# Patient Record
Sex: Female | Born: 1950 | Race: Black or African American | Hispanic: No | Marital: Married | Smoking: Never smoker
Health system: Southern US, Community
[De-identification: ages and names within clinical notes are randomized; demographics above are authoritative.]

## PROBLEM LIST (undated history)

## (undated) DIAGNOSIS — I10 Essential (primary) hypertension: Secondary | ICD-10-CM

## (undated) DIAGNOSIS — E119 Type 2 diabetes mellitus without complications: Secondary | ICD-10-CM

## (undated) DIAGNOSIS — I639 Cerebral infarction, unspecified: Secondary | ICD-10-CM

## (undated) DIAGNOSIS — C801 Malignant (primary) neoplasm, unspecified: Secondary | ICD-10-CM

## (undated) DIAGNOSIS — G629 Polyneuropathy, unspecified: Secondary | ICD-10-CM

## (undated) DIAGNOSIS — D849 Immunodeficiency, unspecified: Secondary | ICD-10-CM

## (undated) DIAGNOSIS — J45909 Unspecified asthma, uncomplicated: Secondary | ICD-10-CM

## (undated) HISTORY — PX: OTHER SURGICAL HISTORY: SHX169

## (undated) HISTORY — PX: CHOLECYSTECTOMY: SHX55

---

## 2013-04-09 DIAGNOSIS — I152 Hypertension secondary to endocrine disorders: Secondary | ICD-10-CM | POA: Diagnosis present

## 2014-10-04 DIAGNOSIS — E119 Type 2 diabetes mellitus without complications: Secondary | ICD-10-CM

## 2014-10-04 DIAGNOSIS — C9000 Multiple myeloma not having achieved remission: Secondary | ICD-10-CM | POA: Diagnosis present

## 2014-10-18 DIAGNOSIS — Z6841 Body Mass Index (BMI) 40.0 and over, adult: Secondary | ICD-10-CM

## 2021-08-18 ENCOUNTER — Encounter (HOSPITAL_COMMUNITY): Payer: Self-pay

## 2021-08-18 ENCOUNTER — Inpatient Hospital Stay (HOSPITAL_COMMUNITY)
Admission: EM | Admit: 2021-08-18 | Discharge: 2021-08-21 | DRG: 041 | Disposition: A | Discharge status: Home Health | Payer: 59 | Attending: Internal Medicine | Admitting: Internal Medicine

## 2021-08-18 ENCOUNTER — Other Ambulatory Visit: Payer: Self-pay

## 2021-08-18 DIAGNOSIS — G459 Transient cerebral ischemic attack, unspecified: Secondary | ICD-10-CM

## 2021-08-18 DIAGNOSIS — G8194 Hemiplegia, unspecified affecting left nondominant side: Secondary | ICD-10-CM | POA: Diagnosis present

## 2021-08-18 DIAGNOSIS — R471 Dysarthria and anarthria: Secondary | ICD-10-CM | POA: Diagnosis present

## 2021-08-18 DIAGNOSIS — Z7982 Long term (current) use of aspirin: Secondary | ICD-10-CM

## 2021-08-18 DIAGNOSIS — I63411 Cerebral infarction due to embolism of right middle cerebral artery: Principal | ICD-10-CM | POA: Diagnosis present

## 2021-08-18 DIAGNOSIS — Z6841 Body Mass Index (BMI) 40.0 and over, adult: Secondary | ICD-10-CM

## 2021-08-18 DIAGNOSIS — E1165 Type 2 diabetes mellitus with hyperglycemia: Secondary | ICD-10-CM | POA: Diagnosis present

## 2021-08-18 DIAGNOSIS — E1159 Type 2 diabetes mellitus with other circulatory complications: Secondary | ICD-10-CM | POA: Diagnosis present

## 2021-08-18 DIAGNOSIS — Z794 Long term (current) use of insulin: Secondary | ICD-10-CM

## 2021-08-18 DIAGNOSIS — Z20822 Contact with and (suspected) exposure to covid-19: Secondary | ICD-10-CM | POA: Diagnosis present

## 2021-08-18 DIAGNOSIS — I69354 Hemiplegia and hemiparesis following cerebral infarction affecting left non-dominant side: Secondary | ICD-10-CM

## 2021-08-18 DIAGNOSIS — G629 Polyneuropathy, unspecified: Secondary | ICD-10-CM

## 2021-08-18 DIAGNOSIS — I639 Cerebral infarction, unspecified: Secondary | ICD-10-CM

## 2021-08-18 DIAGNOSIS — I10 Essential (primary) hypertension: Secondary | ICD-10-CM | POA: Diagnosis present

## 2021-08-18 DIAGNOSIS — E1142 Type 2 diabetes mellitus with diabetic polyneuropathy: Secondary | ICD-10-CM | POA: Diagnosis present

## 2021-08-18 DIAGNOSIS — N1832 Chronic kidney disease, stage 3b: Secondary | ICD-10-CM | POA: Diagnosis present

## 2021-08-18 DIAGNOSIS — Z7984 Long term (current) use of oral hypoglycemic drugs: Secondary | ICD-10-CM

## 2021-08-18 DIAGNOSIS — Q2112 Patent foramen ovale: Secondary | ICD-10-CM

## 2021-08-18 DIAGNOSIS — E785 Hyperlipidemia, unspecified: Secondary | ICD-10-CM | POA: Diagnosis present

## 2021-08-18 DIAGNOSIS — E1122 Type 2 diabetes mellitus with diabetic chronic kidney disease: Secondary | ICD-10-CM | POA: Diagnosis present

## 2021-08-18 DIAGNOSIS — R4781 Slurred speech: Secondary | ICD-10-CM | POA: Diagnosis present

## 2021-08-18 DIAGNOSIS — E119 Type 2 diabetes mellitus without complications: Secondary | ICD-10-CM

## 2021-08-18 DIAGNOSIS — C9000 Multiple myeloma not having achieved remission: Secondary | ICD-10-CM | POA: Diagnosis present

## 2021-08-18 DIAGNOSIS — Z79899 Other long term (current) drug therapy: Secondary | ICD-10-CM

## 2021-08-18 DIAGNOSIS — R29810 Facial weakness: Secondary | ICD-10-CM | POA: Diagnosis present

## 2021-08-18 DIAGNOSIS — I152 Hypertension secondary to endocrine disorders: Secondary | ICD-10-CM | POA: Diagnosis present

## 2021-08-18 DIAGNOSIS — R29702 NIHSS score 2: Secondary | ICD-10-CM | POA: Diagnosis present

## 2021-08-18 HISTORY — DX: Essential (primary) hypertension: I10

## 2021-08-18 HISTORY — DX: Type 2 diabetes mellitus without complications: E11.9

## 2021-08-18 HISTORY — DX: Cerebral infarction, unspecified: I63.9

## 2021-08-18 HISTORY — DX: Malignant (primary) neoplasm, unspecified: C80.1

## 2021-08-18 HISTORY — DX: Immunodeficiency, unspecified: D84.9

## 2021-08-18 HISTORY — DX: Unspecified asthma, uncomplicated: J45.909

## 2021-08-18 HISTORY — DX: Polyneuropathy, unspecified: G62.9

## 2021-08-18 NOTE — ED Triage Notes (Signed)
BIB [facility] from home sudden onset of excessive aphasia lasted approximately 58min. Subsided at this time CVA and 2 TIA in 2022 with left side deficits. Port to rt chest. CBG 381

## 2021-08-19 ENCOUNTER — Emergency Department (HOSPITAL_COMMUNITY): Payer: 59

## 2021-08-19 ENCOUNTER — Observation Stay (HOSPITAL_BASED_OUTPATIENT_CLINIC_OR_DEPARTMENT_OTHER): Payer: 59

## 2021-08-19 ENCOUNTER — Observation Stay (HOSPITAL_COMMUNITY): Payer: 59

## 2021-08-19 DIAGNOSIS — R479 Unspecified speech disturbances: Secondary | ICD-10-CM | POA: Diagnosis not present

## 2021-08-19 DIAGNOSIS — I152 Hypertension secondary to endocrine disorders: Secondary | ICD-10-CM

## 2021-08-19 DIAGNOSIS — G459 Transient cerebral ischemic attack, unspecified: Secondary | ICD-10-CM | POA: Diagnosis not present

## 2021-08-19 DIAGNOSIS — I69354 Hemiplegia and hemiparesis following cerebral infarction affecting left non-dominant side: Secondary | ICD-10-CM | POA: Diagnosis not present

## 2021-08-19 DIAGNOSIS — Z794 Long term (current) use of insulin: Secondary | ICD-10-CM

## 2021-08-19 DIAGNOSIS — I639 Cerebral infarction, unspecified: Secondary | ICD-10-CM | POA: Diagnosis not present

## 2021-08-19 DIAGNOSIS — I63411 Cerebral infarction due to embolism of right middle cerebral artery: Principal | ICD-10-CM

## 2021-08-19 DIAGNOSIS — C9000 Multiple myeloma not having achieved remission: Secondary | ICD-10-CM

## 2021-08-19 DIAGNOSIS — E119 Type 2 diabetes mellitus without complications: Secondary | ICD-10-CM

## 2021-08-19 DIAGNOSIS — E1159 Type 2 diabetes mellitus with other circulatory complications: Secondary | ICD-10-CM | POA: Diagnosis not present

## 2021-08-19 DIAGNOSIS — Z6841 Body Mass Index (BMI) 40.0 and over, adult: Secondary | ICD-10-CM

## 2021-08-19 LAB — RAPID URINE DRUG SCREEN, HOSP PERFORMED
Amphetamines: NOT DETECTED
Barbiturates: NOT DETECTED
Benzodiazepines: NOT DETECTED
Cocaine: NOT DETECTED
Opiates: NOT DETECTED
Tetrahydrocannabinol: NOT DETECTED

## 2021-08-19 LAB — DIFFERENTIAL
Abs Immature Granulocytes: 0.02 10*3/uL (ref 0.00–0.07)
Basophils Absolute: 0 10*3/uL (ref 0.0–0.1)
Basophils Relative: 0 %
Eosinophils Absolute: 0.1 10*3/uL (ref 0.0–0.5)
Eosinophils Relative: 1 %
Immature Granulocytes: 0 %
Lymphocytes Relative: 11 %
Lymphs Abs: 0.8 10*3/uL (ref 0.7–4.0)
Monocytes Absolute: 0.7 10*3/uL (ref 0.1–1.0)
Monocytes Relative: 10 %
Neutro Abs: 5.3 10*3/uL (ref 1.7–7.7)
Neutrophils Relative %: 78 %

## 2021-08-19 LAB — CBC
HCT: 36.2 % (ref 36.0–46.0)
Hemoglobin: 11.7 g/dL — ABNORMAL LOW (ref 12.0–15.0)
MCH: 27.5 pg (ref 26.0–34.0)
MCHC: 32.3 g/dL (ref 30.0–36.0)
MCV: 85.2 fL (ref 80.0–100.0)
Platelets: 162 10*3/uL (ref 150–400)
RBC: 4.25 MIL/uL (ref 3.87–5.11)
RDW: 15.4 % (ref 11.5–15.5)
WBC: 6.9 10*3/uL (ref 4.0–10.5)
nRBC: 0 % (ref 0.0–0.2)

## 2021-08-19 LAB — GLUCOSE, CAPILLARY
Glucose-Capillary: 221 mg/dL — ABNORMAL HIGH (ref 70–99)
Glucose-Capillary: 307 mg/dL — ABNORMAL HIGH (ref 70–99)
Glucose-Capillary: 112 mg/dL — ABNORMAL HIGH (ref 70–99)

## 2021-08-19 LAB — URINALYSIS, ROUTINE W REFLEX MICROSCOPIC
Bilirubin Urine: NEGATIVE
Glucose, UA: >=500 mg/dL — AB
Hgb urine dipstick: NEGATIVE
Ketones, ur: 5 mg/dL — AB
Nitrite: NEGATIVE
Protein, ur: 100 mg/dL — AB
Specific Gravity, Urine: 1.021 (ref 1.005–1.030)
WBC, UA: >50 WBC/hpf — ABNORMAL HIGH (ref 0–5)
pH: 5 (ref 5.0–8.0)

## 2021-08-19 LAB — ECHOCARDIOGRAM COMPLETE
AR max vel: 1.87 cm2
AV Area VTI: 1.8 cm2
AV Area mean vel: 1.86 cm2
AV Mean grad: 3 mmHg
AV Peak grad: 5 mmHg
Ao pk vel: 1.12 m/s
Area-P 1/2: 2.59 cm2
Height: 68 in
MV VTI: 1.59 cm2
S' Lateral: 3 cm
Weight: 4160 oz

## 2021-08-19 LAB — RESP PANEL BY RT-PCR (FLU A&B, COVID) ARPGX2
Influenza A by PCR: NEGATIVE
Influenza B by PCR: NEGATIVE
SARS Coronavirus 2 by RT PCR: NEGATIVE

## 2021-08-19 LAB — COMPREHENSIVE METABOLIC PANEL
ALT: 11 U/L (ref 0–44)
AST: 23 U/L (ref 15–41)
Albumin: 3.2 g/dL — ABNORMAL LOW (ref 3.5–5.0)
Alkaline Phosphatase: 109 U/L (ref 38–126)
Anion gap: 11 (ref 5–15)
BUN: 10 mg/dL (ref 8–23)
CO2: 25 mmol/L (ref 22–32)
Calcium: 9 mg/dL (ref 8.9–10.3)
Chloride: 104 mmol/L (ref 98–111)
Creatinine, Ser: 1.27 mg/dL — ABNORMAL HIGH (ref 0.44–1.00)
GFR, Estimated: 46 mL/min — ABNORMAL LOW (ref >60)
Glucose, Bld: 328 mg/dL — ABNORMAL HIGH (ref 70–99)
Potassium: 4.1 mmol/L (ref 3.5–5.1)
Sodium: 140 mmol/L (ref 135–145)
Total Bilirubin: 0.9 mg/dL (ref 0.3–1.2)
Total Protein: 6.1 g/dL — ABNORMAL LOW (ref 6.5–8.1)

## 2021-08-19 LAB — LIPID PANEL
Cholesterol: 118 mg/dL (ref 0–200)
HDL: 47 mg/dL (ref >40)
LDL Cholesterol: 47 mg/dL (ref 0–99)
Total CHOL/HDL Ratio: 2.5 RATIO
Triglycerides: 118 mg/dL (ref <150)
VLDL: 24 mg/dL (ref 0–40)

## 2021-08-19 LAB — HIV ANTIBODY (ROUTINE TESTING W REFLEX): HIV Screen 4th Generation wRfx: NONREACTIVE

## 2021-08-19 LAB — ETHANOL: Alcohol, Ethyl (B): <10 mg/dL (ref <10)

## 2021-08-19 LAB — APTT: aPTT: 27 seconds (ref 24–36)

## 2021-08-19 LAB — PROTIME-INR
INR: 1 (ref 0.8–1.2)
Prothrombin Time: 13.4 seconds (ref 11.4–15.2)

## 2021-08-19 LAB — CBG MONITORING, ED: Glucose-Capillary: 209 mg/dL — ABNORMAL HIGH (ref 70–99)

## 2021-08-19 IMAGING — CT CT HEAD W/O CM
3 series · 16 of 47 positions shown, 19 images · non-contrast
Comparison: None.

CLINICAL DATA: Sudden onset of expressive aphasia.



[Series 2: head 5.0 h30s · axial · 0.43mm/px · z∈[-125,+25]mm · 10 of 36 slices shown, 13 images]
[im 3/36  brain]
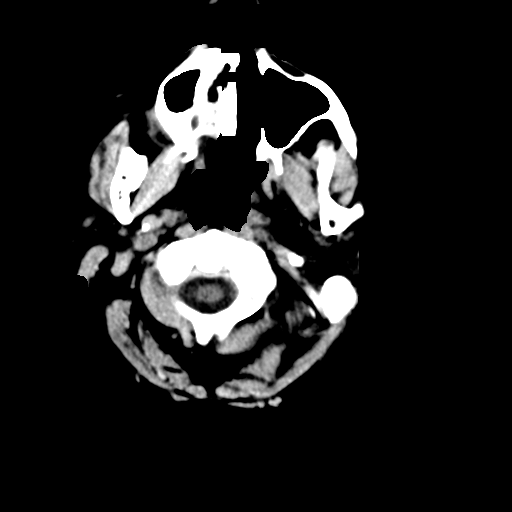
[im 3/36  bone]
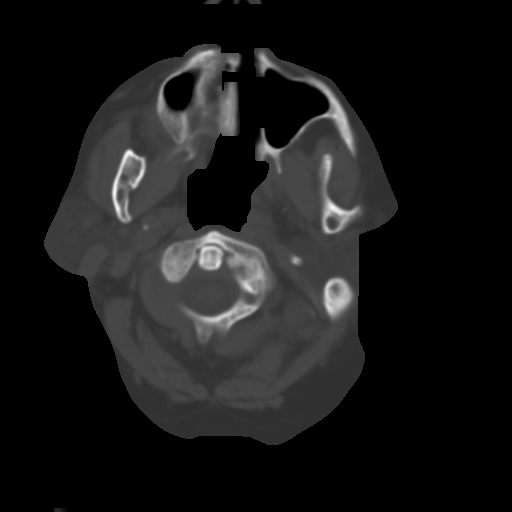
[im 7/36  brain]
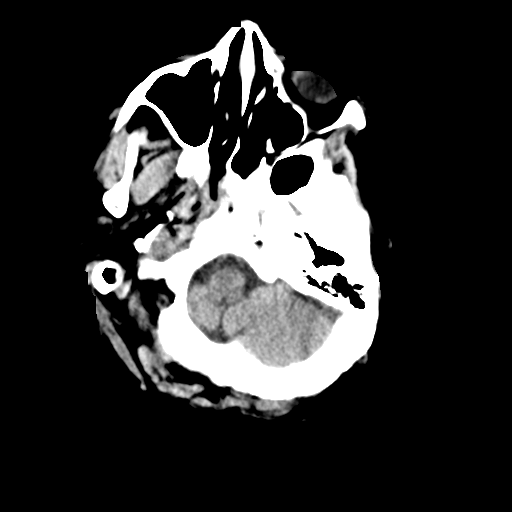
[im 10/36  brain]
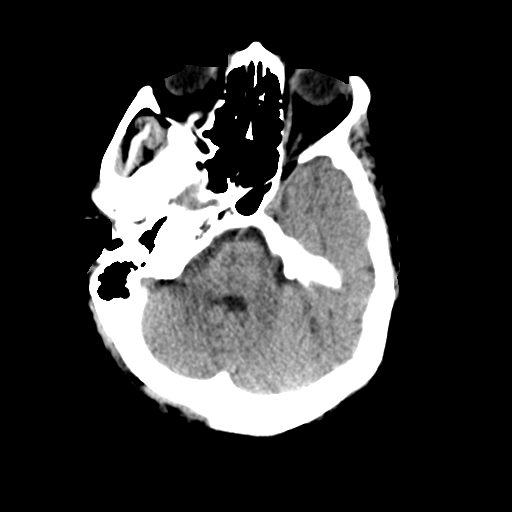
[im 13/36  brain]
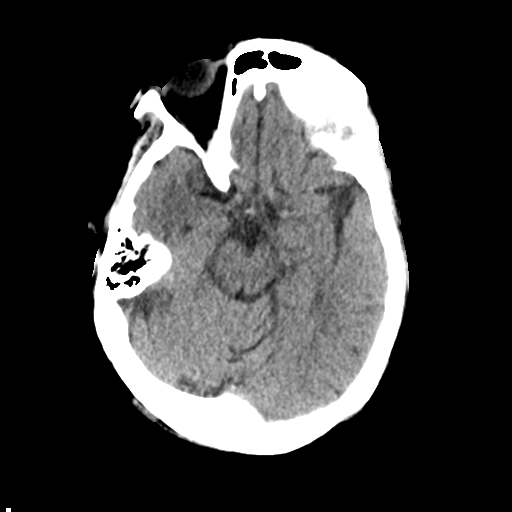
[im 16/36  brain]
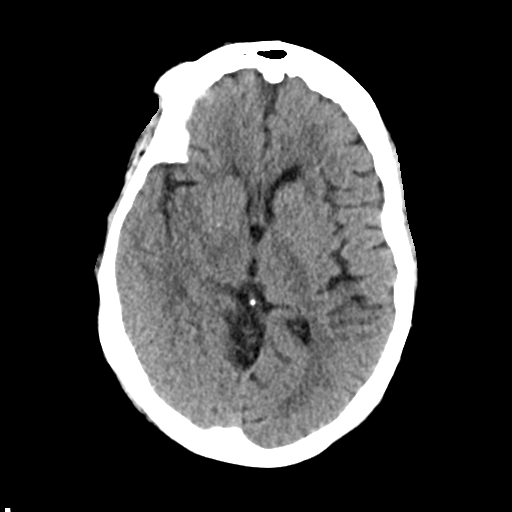
[im 16/36  bone]
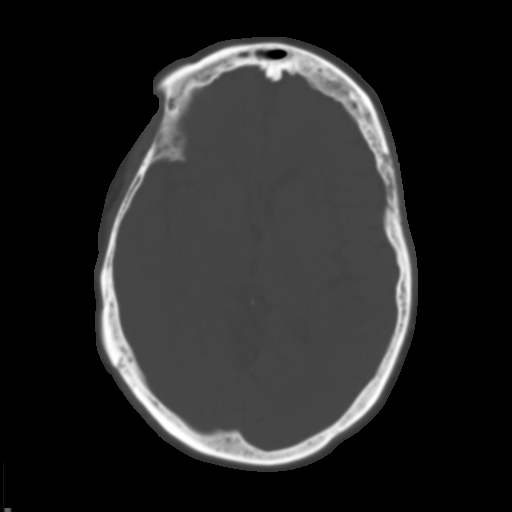
[im 20/36  brain]
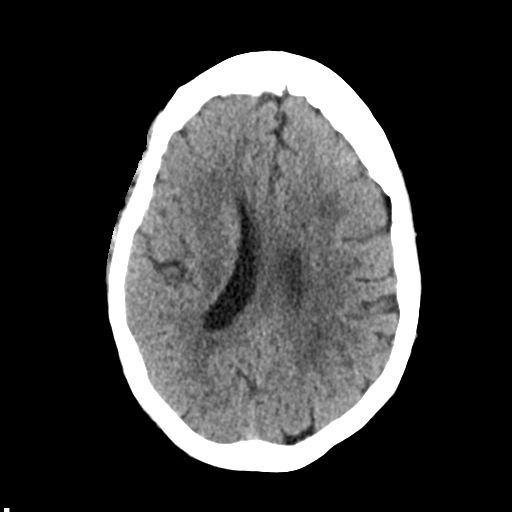
[im 23/36  brain]
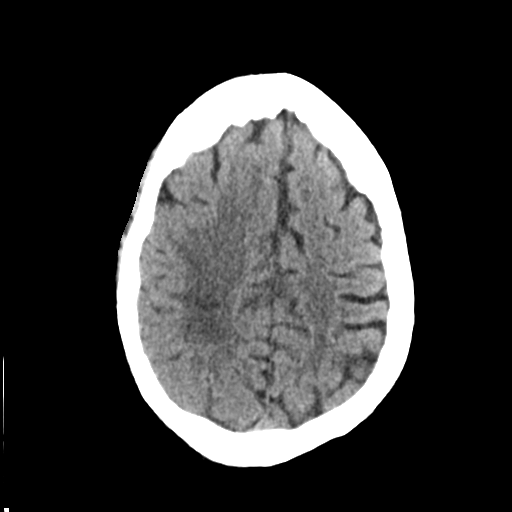
[im 27/36  brain]
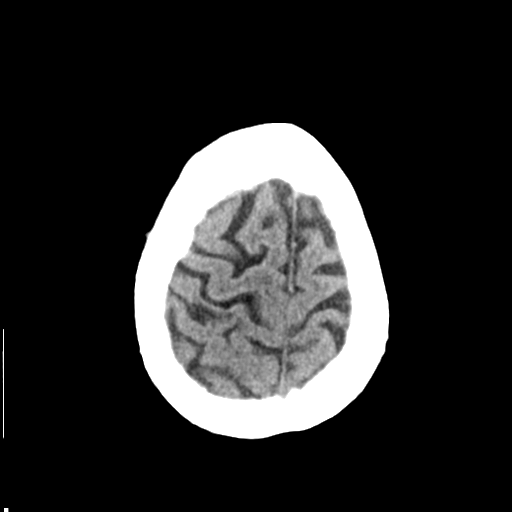
[im 29/36  brain]
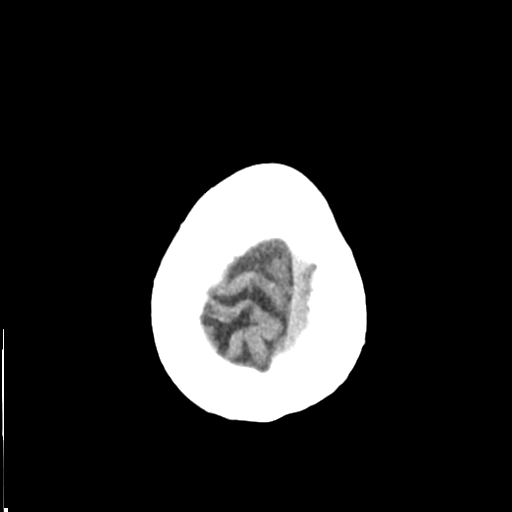
[im 29/36  bone]
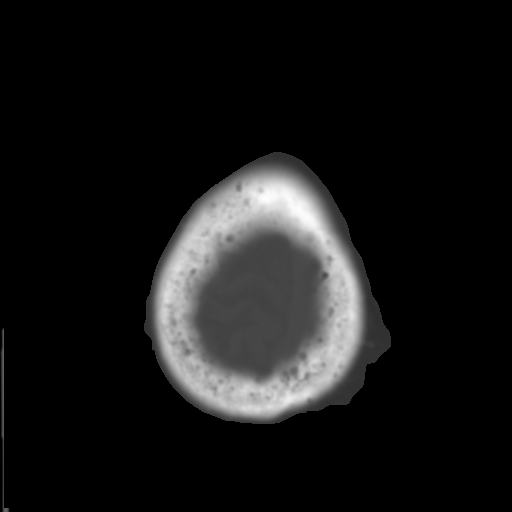
[im 33/36  brain]
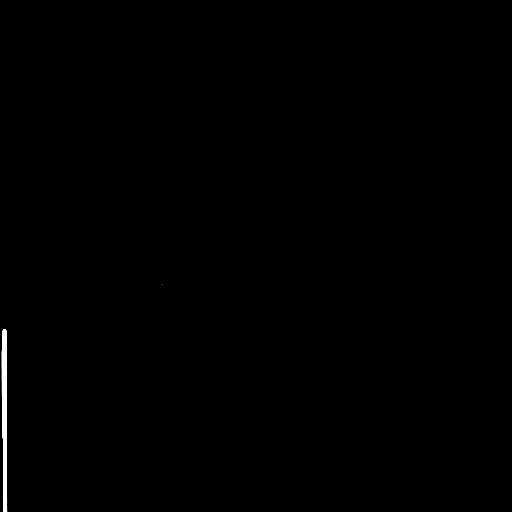

[Series 4: head 3.0 mpr cor · coronal · 0.33mm/px · 3 of 68 slices shown]
[im 23/68  brain]
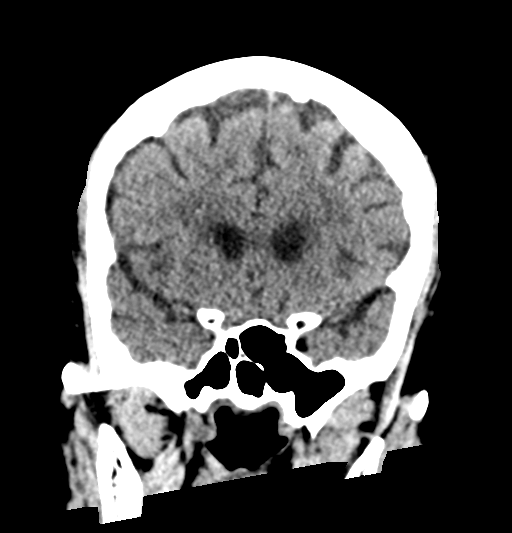
[im 30/68  brain]
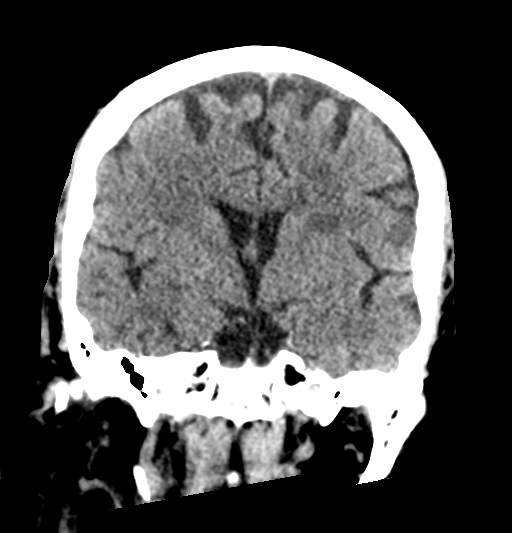
[im 38/68  brain]
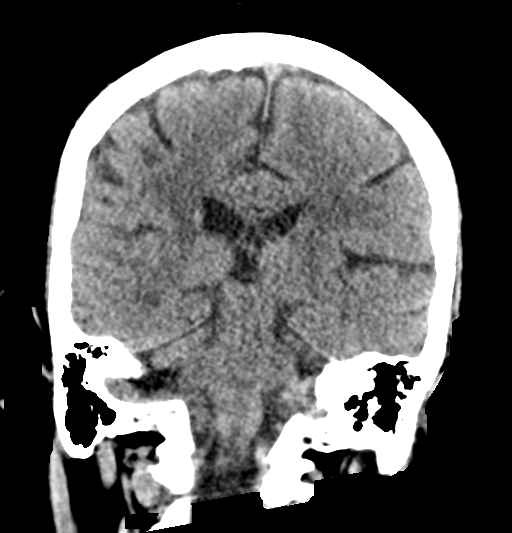

[Series 5: head 3.0 mpr sag · sagittal · 0.36mm/px · 3 of 57 slices shown]
[im 24/57  brain]
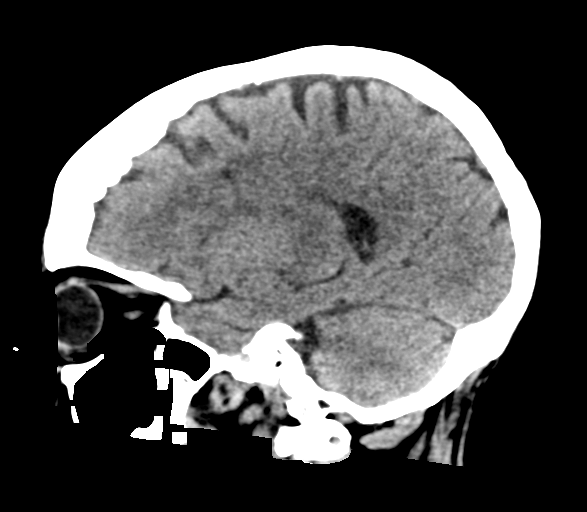
[im 29/57  brain]
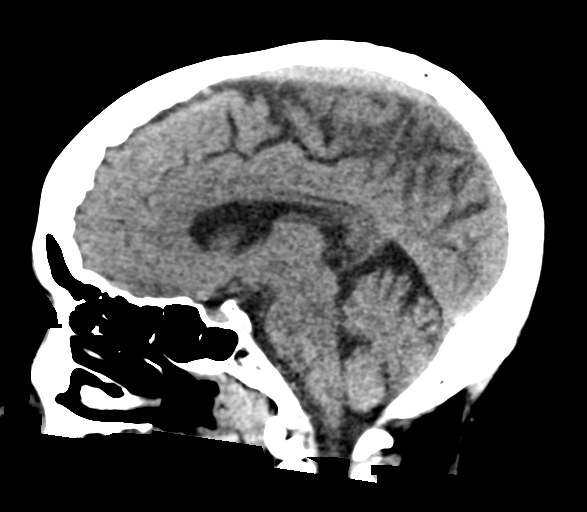
[im 33/57  brain]
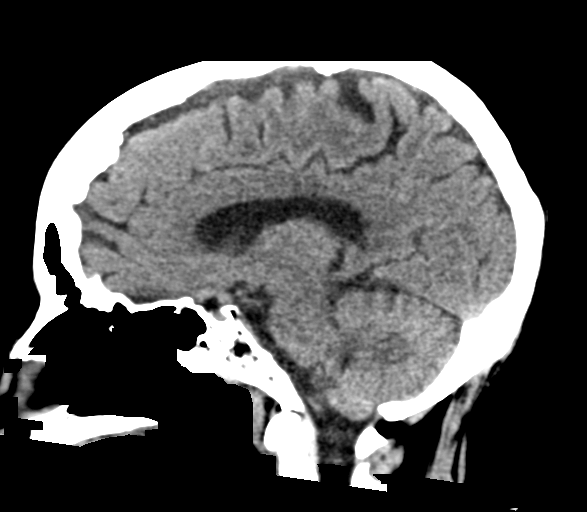

[16 of 47 positions shown; findings below may reference images not displayed]

FINDINGS: Brain: There is mild cerebral atrophy with widening of the
extra-axial spaces and ventricular dilatation.
There are areas of decreased attenuation within the white matter
tracts of the supratentorial brain, consistent with microvascular
disease changes.

Small chronic right anterior parietal and right occipital lobe
infarcts are seen.

Vascular: No hyperdense vessel or unexpected calcification.

Skull: Negative for acute fracture. Innumerable benign appearing
subcentimeter round lytic areas are seen throughout the skull.

Sinuses/Orbits: No acute finding.

Other: None.
IMPRESSION: 1. No acute intracranial abnormality.
2. Small chronic right anterior parietal and right occipital lobe
infarcts.
3. Generalized cerebral atrophy

## 2021-08-19 IMAGING — MR MR MRA HEAD W/O CM
2 series · 19 of 48 positions shown · non-contrast
Comparison: Noncontrast head CT [MY] hours today.

CLINICAL DATA: 70-year-old female TIA.

EXAM:
MRA HEAD WITHOUT CONTRAST
TECHNIQUE: Angiographic images of the Circle of Willis were acquired using MRA
technique without intravenous contrast.

[Series 2: ax (id) · axial · 1.0mm · 0.43mm/px · z∈[-49,+38]mm · 18 of 183 slices shown]
[im 1/183]
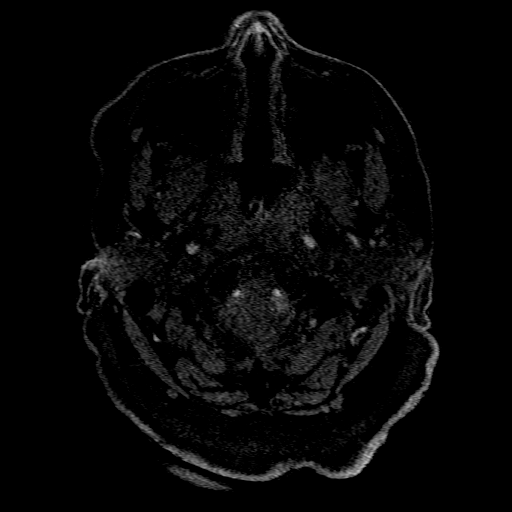
[im 4/183]
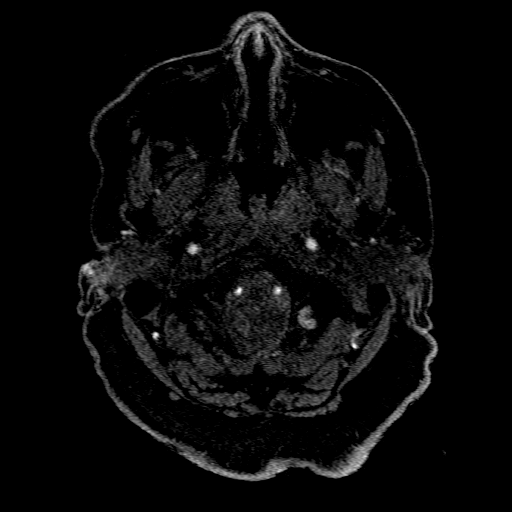
[im 8/183]
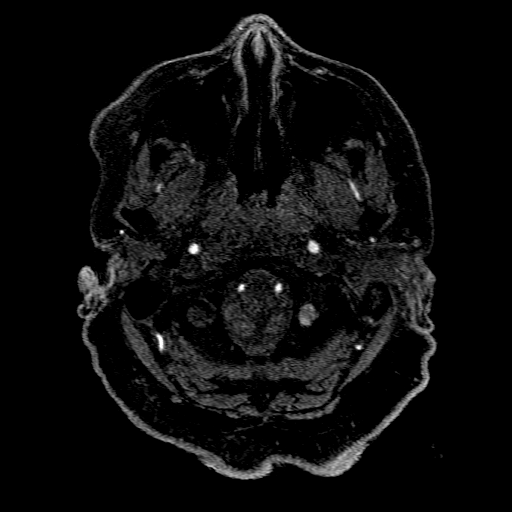
[im 12/183]
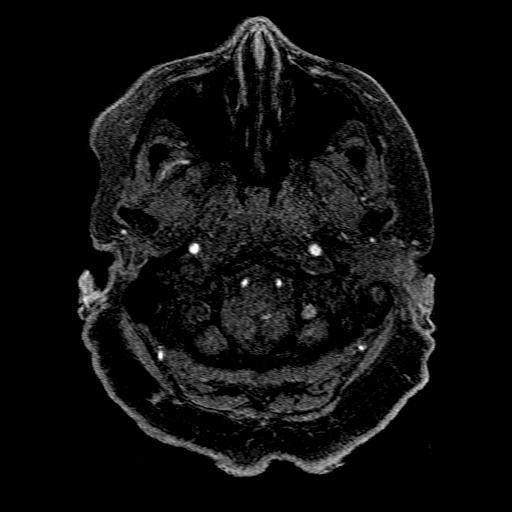
[im 16/183]
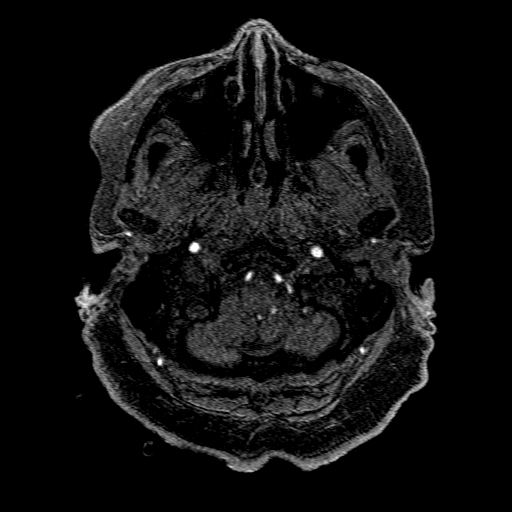
[im 20/183]
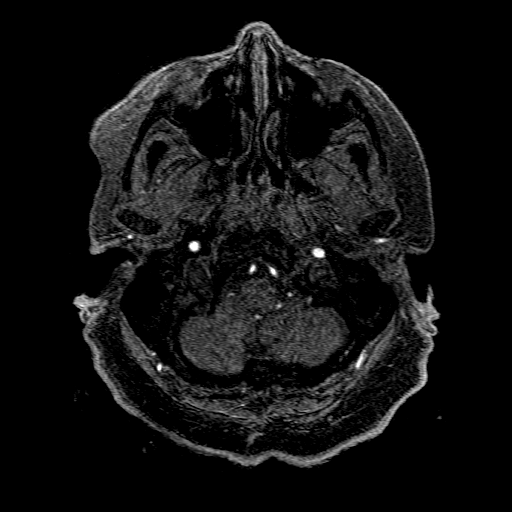
[im 24/183]
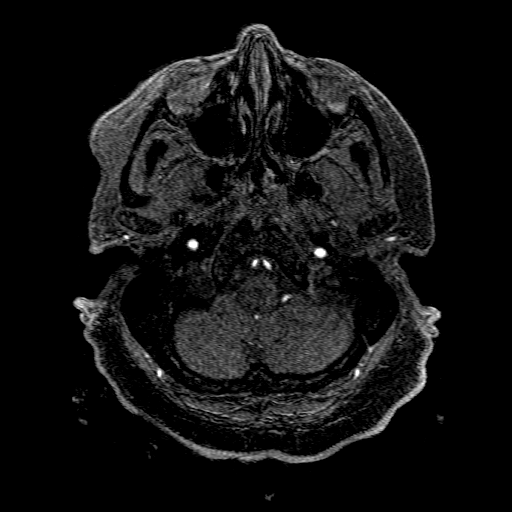
[im 28/183]
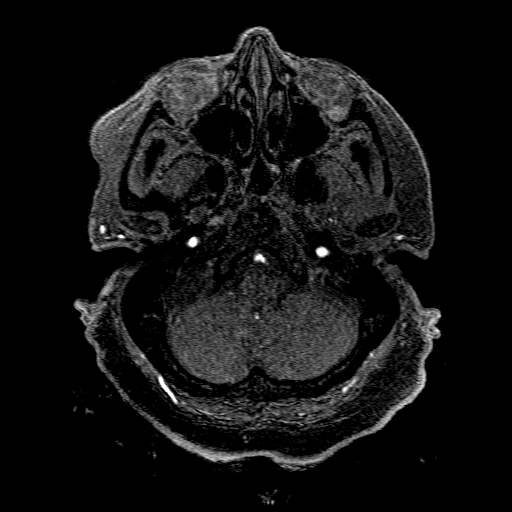
[im 32/183]
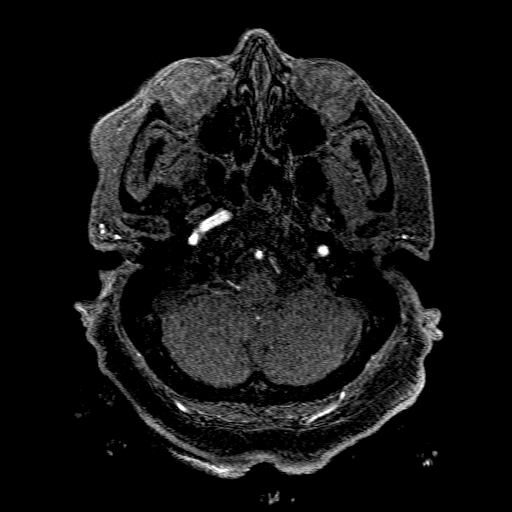
[im 36/183]
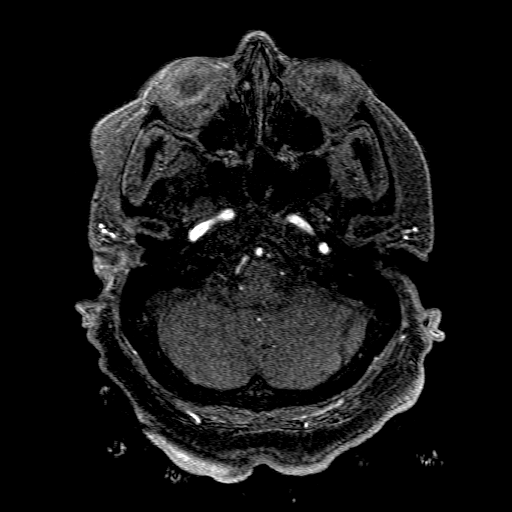
[im 56/183]
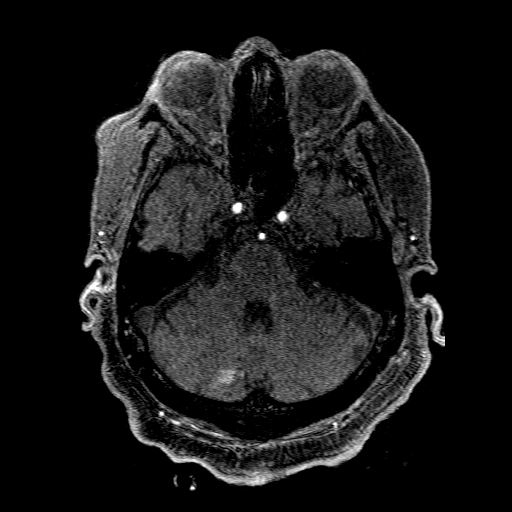
[im 80/183]
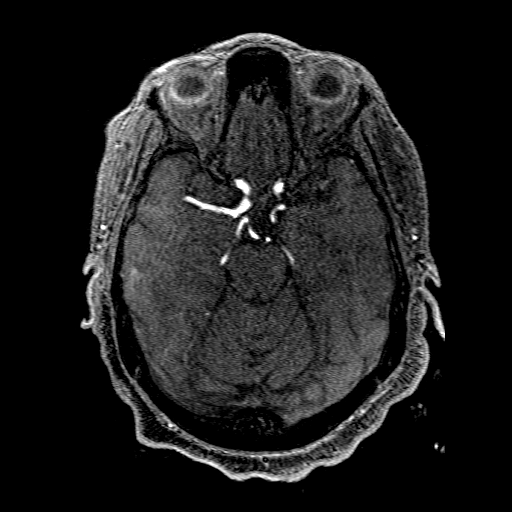
[im 92/183]
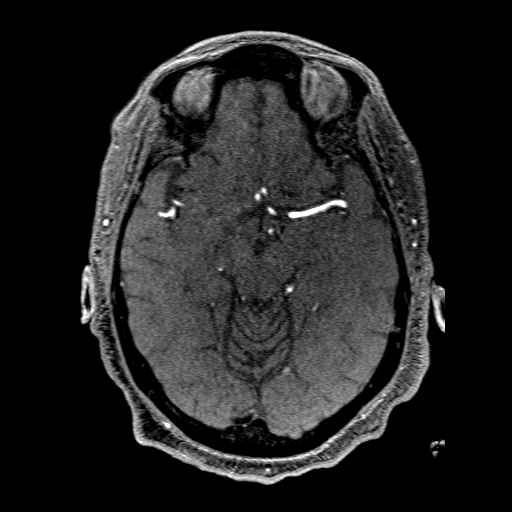
[im 103/183]
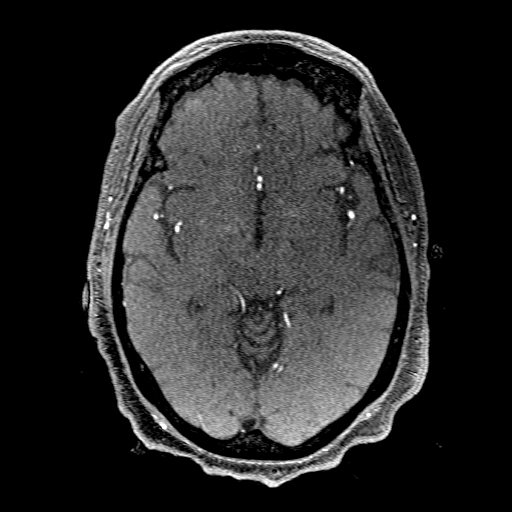
[im 127/183]
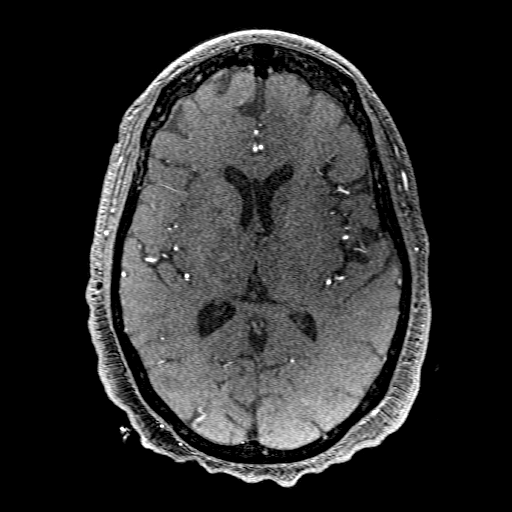
[im 151/183]
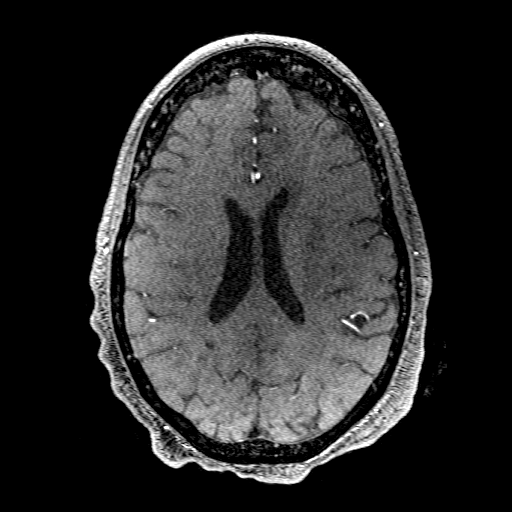
[im 155/183]
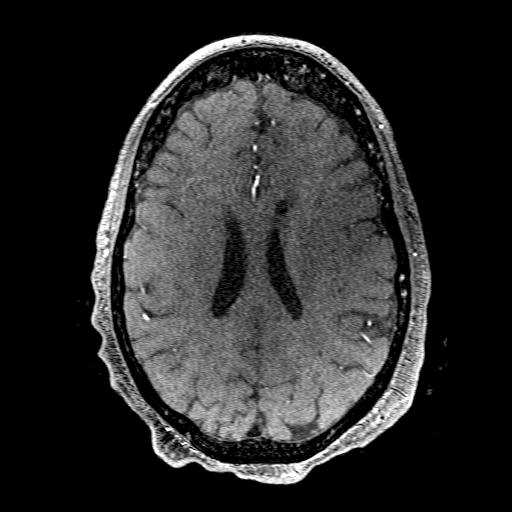
[im 175/183]
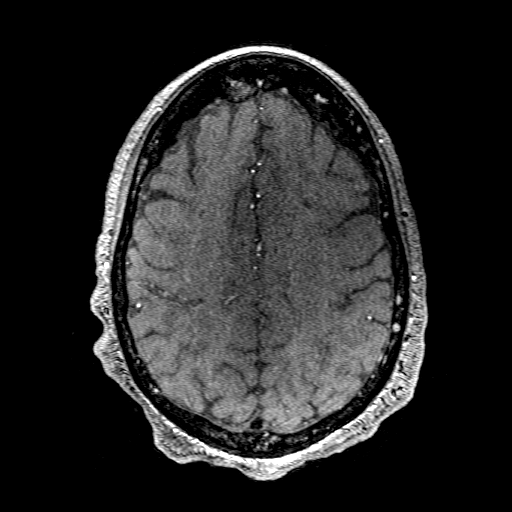

[Series 201: pjn:ax (id) · sagittal · 1.0mm · 0.43mm/px · 1 of 5 slices shown]
[im 1/5]
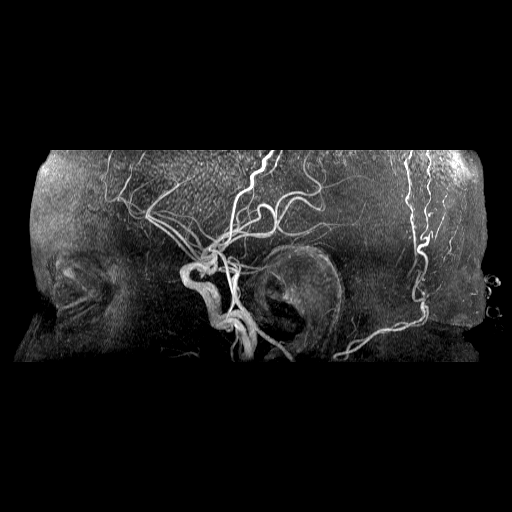

[19 of 48 positions shown; findings below may reference images not displayed]

FINDINGS: Anterior circulation: Antegrade flow in both ICA siphons. Mild
siphon irregularity. No siphon stenosis. Normal bilateral posterior
communicating artery origins. Patent carotid termini, MCA and ACA
origins. Diminutive or absent anterior communicating artery. Visible
bilateral ACA branches are within normal limits. Bilateral MCA M1
segments and MCA bifurcations are patent. Visible bilateral MCA
branches are within normal limits.

Posterior circulation: Antegrade flow in the posterior circulation
with codominant distal vertebral arteries. Patent left PICA and
dominant appearing right AICA origins. No distal vertebral or
vertebrobasilar junction stenosis. Patent basilar artery without
stenosis. Patent SCA and PCA origins. Tortuous right PCA P1 segment.
Fetal type left PCA origin. Right posterior communicating artery
present but smaller. Bilateral PCA branches are within normal
limits.

Anatomic variants: Fetal type left PCA origin.

Other: Abnormal susceptibility in the posterior right greater than
left cerebellum, follow-up MRI brain pending at this time. No
intracranial mass effect or ventriculomegaly.
IMPRESSION: 1. Negative intracranial MRA.
2. Brain MRI pending, reported separately.

## 2021-08-19 IMAGING — MR MR MRA NECK W/O CM
1 of 3 series · 19 of 48 positions shown · non-contrast
Comparison: Brain MRI and intracranial MRA today reported
separately.

CLINICAL DATA: 70-year-old female with TIA. Patchy acute right MCA
and acute to subacute right PCA territory infarcts on brain MRI
today.

EXAM:
MRA NECK WITHOUT CONTRAST
TECHNIQUE: Angiographic images of the neck were acquired using MRA technique
without intravenous contrast. Carotid stenosis measurements (when
applicable) are obtained utilizing NASCET criteria, using the distal
internal carotid diameter as the denominator.

[Series 4: sag inhance (id) · sagittal · 1.2mm · 0.47mm/px · 19 of 357 slices shown]
[im 1/357]
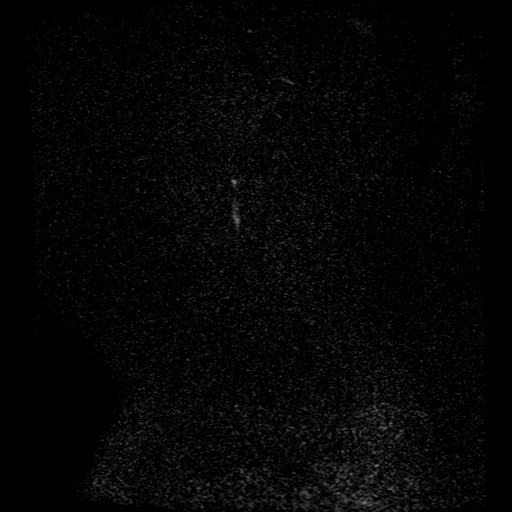
[im 13/357]
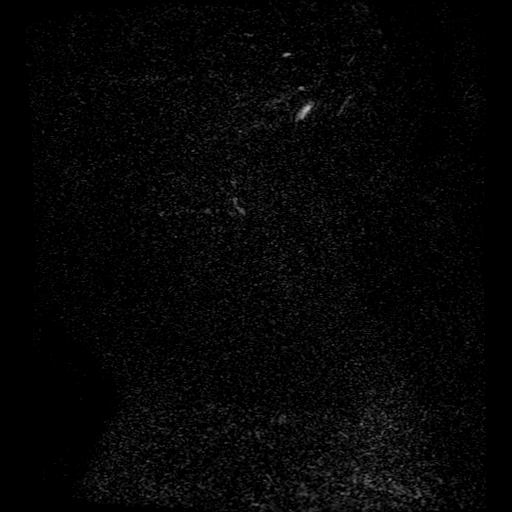
[im 26/357]
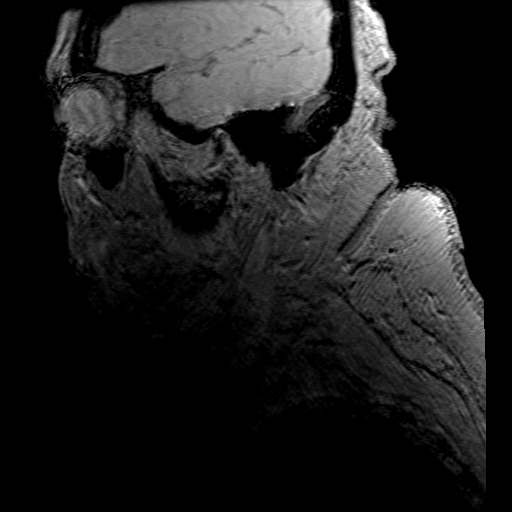
[im 39/357]
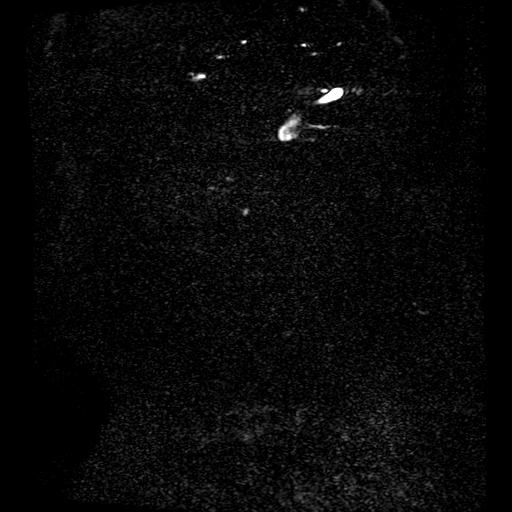
[im 51/357]
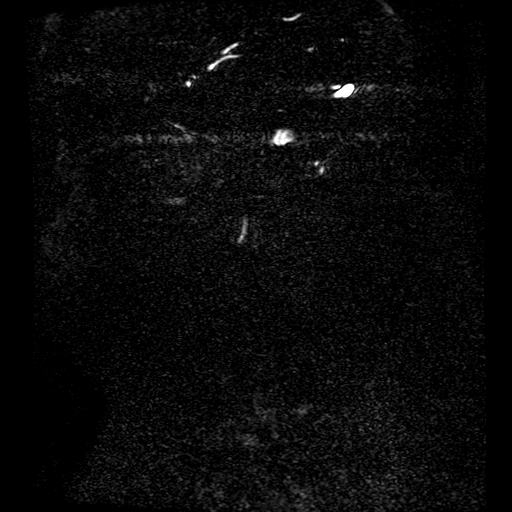
[im 64/357]
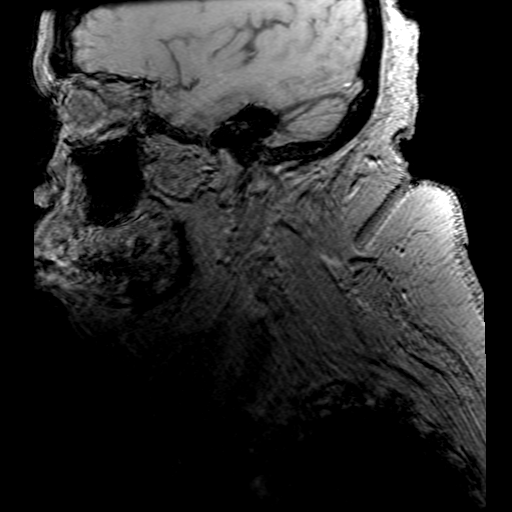
[im 77/357]
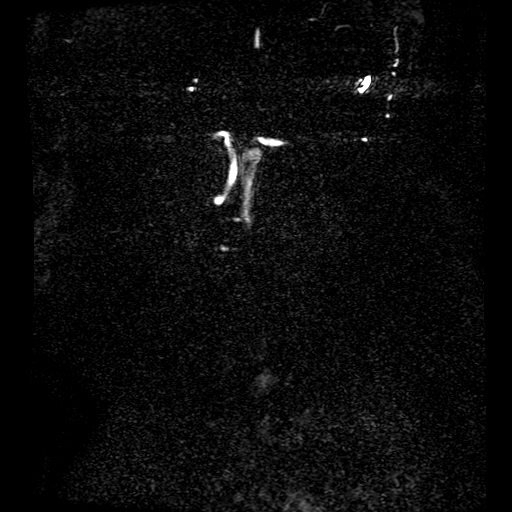
[im 90/357]
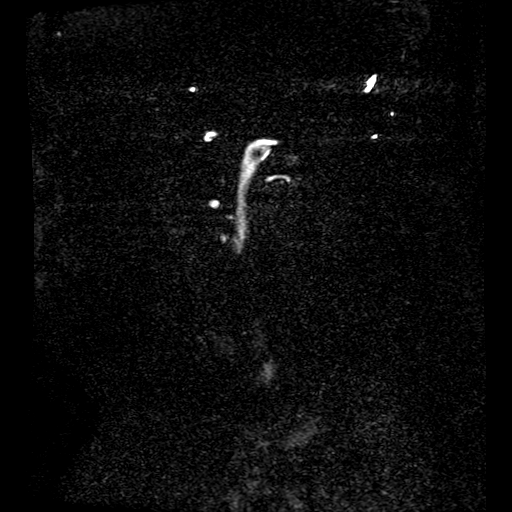
[im 102/357]
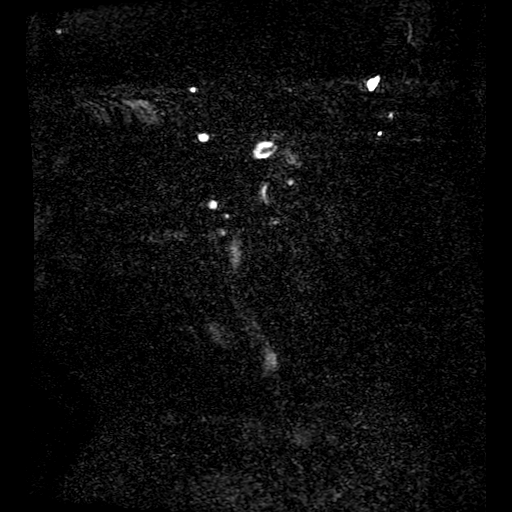
[im 115/357]
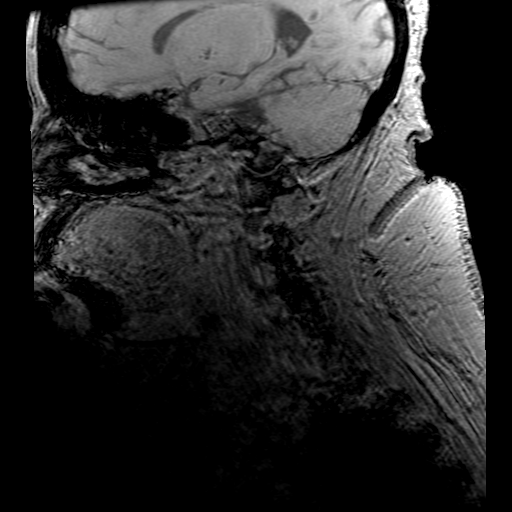
[im 128/357]
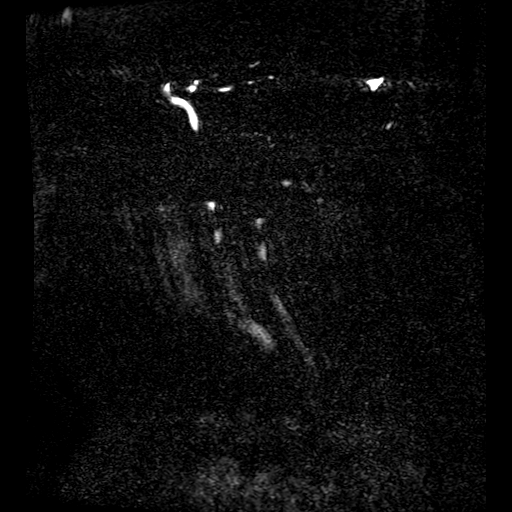
[im 140/357]
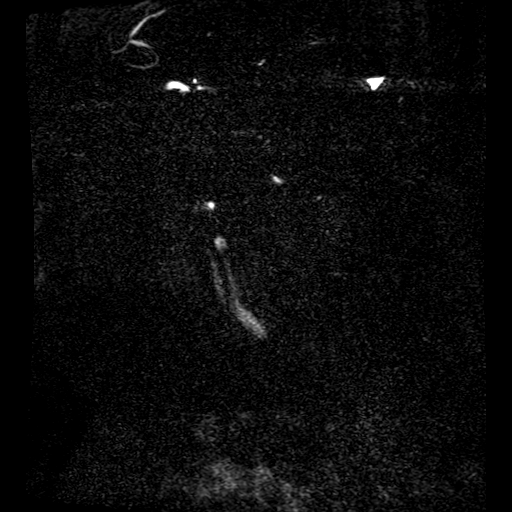
[im 153/357]
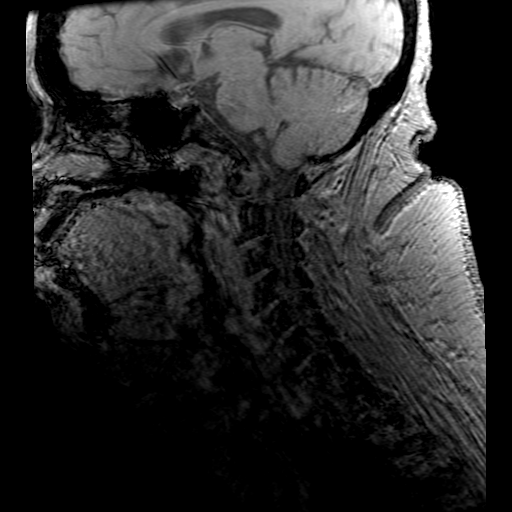
[im 179/357]
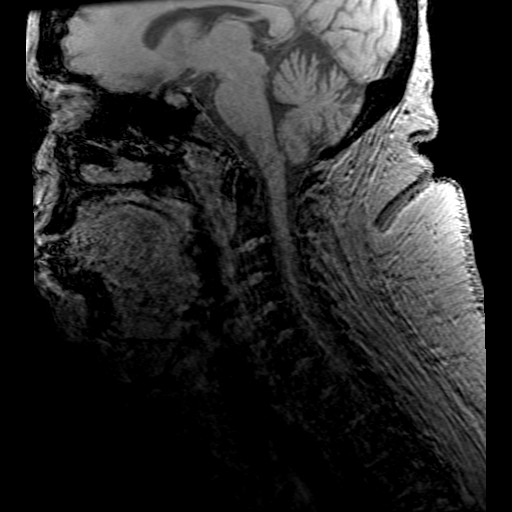
[im 204/357]
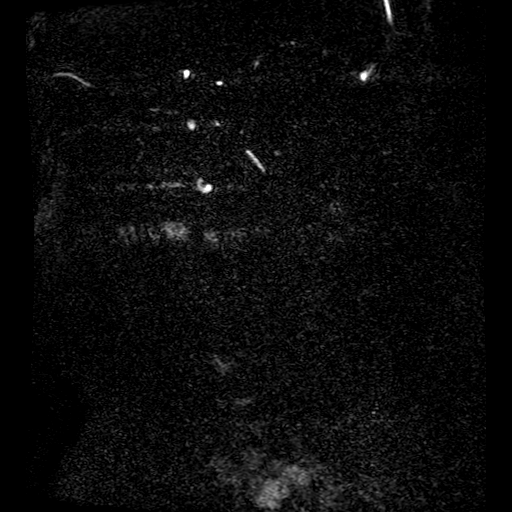
[im 242/357]
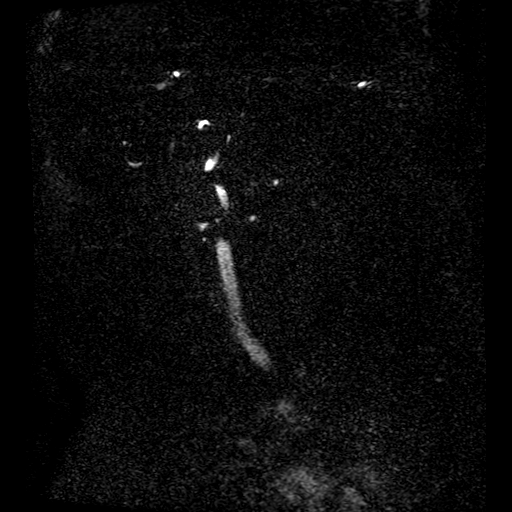
[im 293/357]
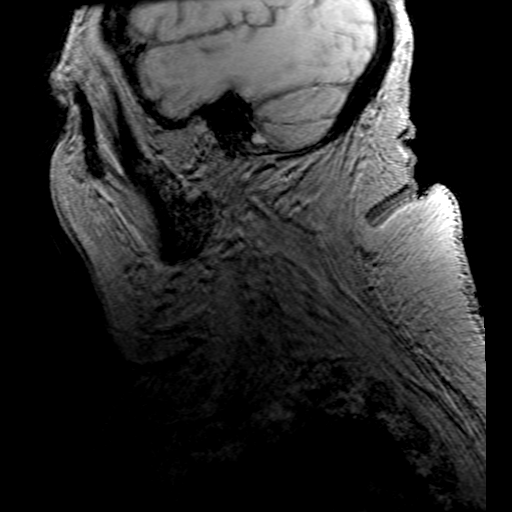
[im 306/357]
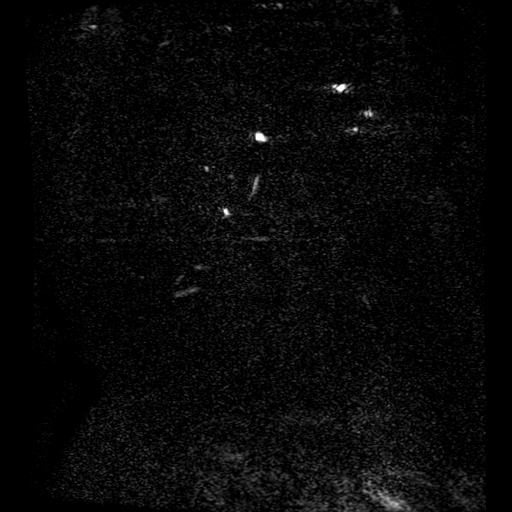
[im 344/357]
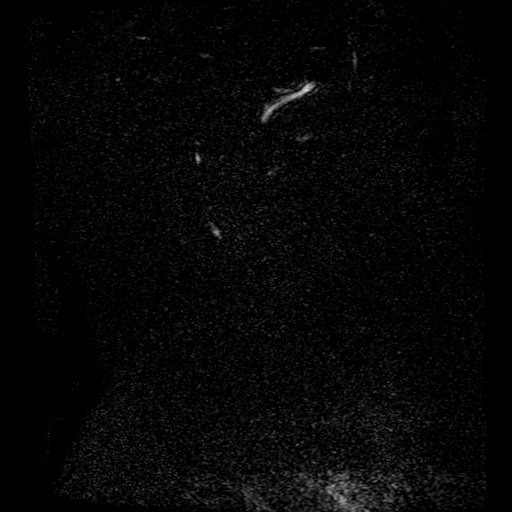

[19 of 48 positions shown; findings below may reference images not displayed]

FINDINGS: 3D time-of-flight imaging. Mild motion degradation.

Limited detail of the aortic arch and proximal great vessels.

Partially retropharyngeal course of the right carotid in the neck
with preserved antegrade right carotid flow to the skull base. No
evidence of hemodynamically significant stenosis.

Antegrade left carotid flow in the neck to the skull base. Tortuous
left ICA distal to the bulb, but no evidence of hemodynamically
significant stenosis.

Codominant appearing cervical vertebral arteries with antegrade flow
signal to the vertebrobasilar junction. Proximal vertebral artery
detail limited by motion, but no evidence of hemodynamically
significant stenosis.

Visible intracranial arteries appear stable from the earlier
circle-of-Willis MRA.
IMPRESSION: 1. No evidence of hemodynamically significant cervical carotid or
vertebral artery stenosis.
2. Tortuous carotid arteries, retropharyngeal on the right.

## 2021-08-19 IMAGING — DX DG CHEST 1V PORT
1 series · 1 of 1 positions shown · non-contrast
Comparison: None.

CLINICAL DATA: Port placement.

EXAM:
PORTABLE CHEST 1 VIEW

[chest]
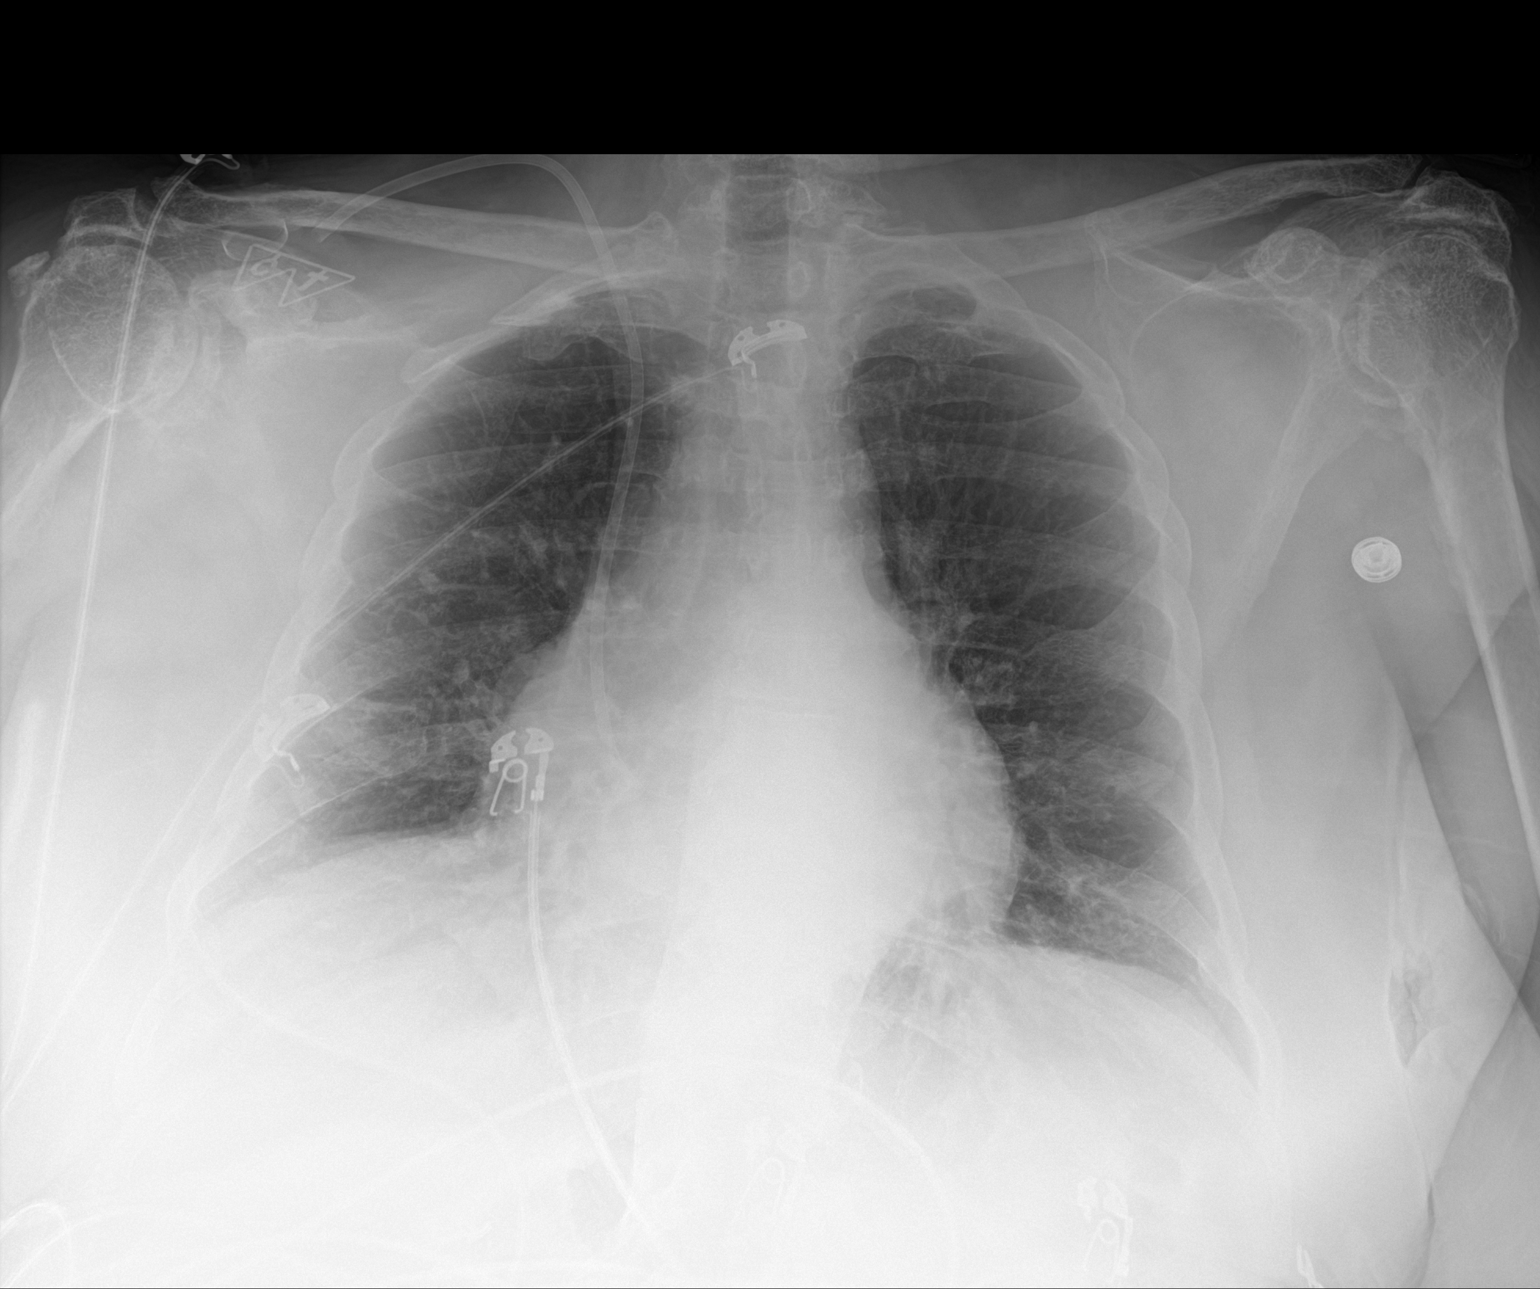

[1 of 1 positions shown; findings below may reference images not displayed]

FINDINGS: The heart size and mediastinal contours are within normal limits.
The lungs are clear without effusions or infiltrates. No
pneumothorax is seen. No acute osseous abnormality. A right internal
jugular chest port terminates over the right atrium.
IMPRESSION: Right chest port terminates over the right atrium.

## 2021-08-19 IMAGING — MR MR HEAD W/O CM
6 of 10 series · 27 of 48 positions shown · non-contrast
Comparison: Head CT [K8] hours today. Intracranial MRA today.

CLINICAL DATA: 70-year-old female TIA.

EXAM:
MRI HEAD WITHOUT CONTRAST
TECHNIQUE: Multiplanar, multiecho pulse sequences of the brain and surrounding
structures were obtained without intravenous contrast.

[Series 2: DWI · axial · 3.0mm · 0.94mm/px · z∈[-67,+80]mm · 8 of 98 slices shown (1 of 2)]
[im 1/98]
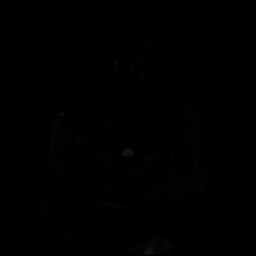
[im 11/98]
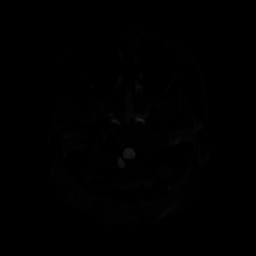
[im 33/98]
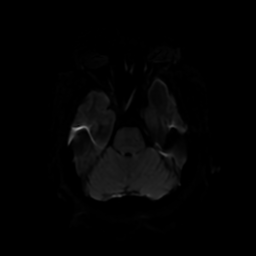
[im 44/98]
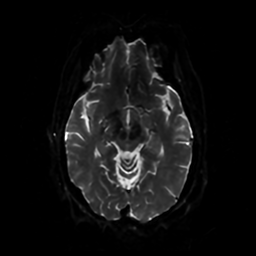
[im 54/98]
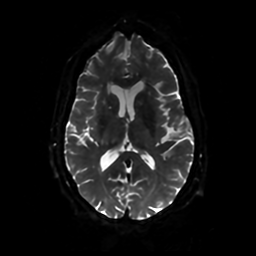
[im 65/98]
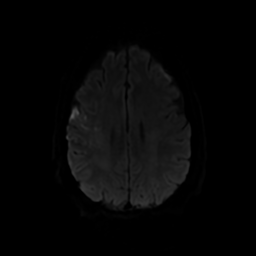
[im 87/98]
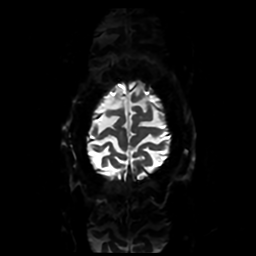
[im 98/98]
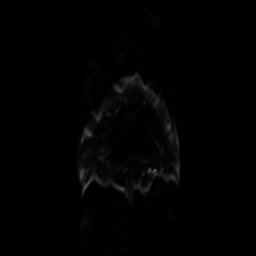

[Series 3: DWI · coronal · 4.0mm · 0.94mm/px · 7 of 74 slices shown (2 of 2)]
[im 1/74]
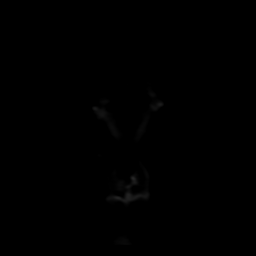
[im 13/74]
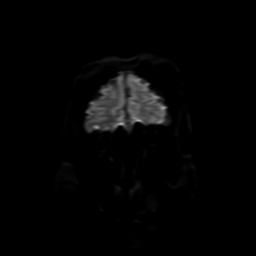
[im 25/74]
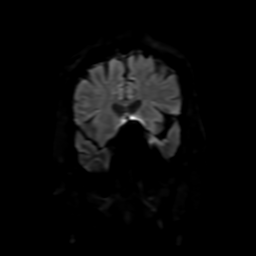
[im 37/74]
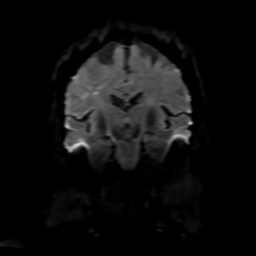
[im 49/74]
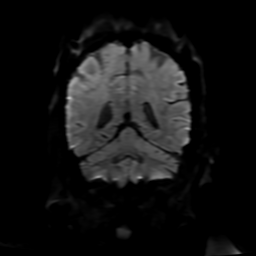
[im 61/74]
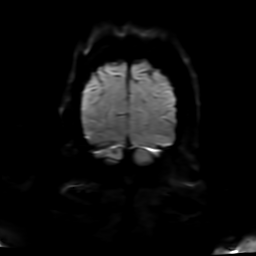
[im 74/74]
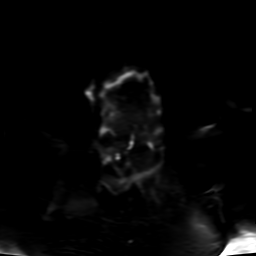

[Series 4: FLAIR · sagittal · 5.0mm · 0.23mm/px · 2 of 25 slices shown (1 of 2)]
[im 1/25]
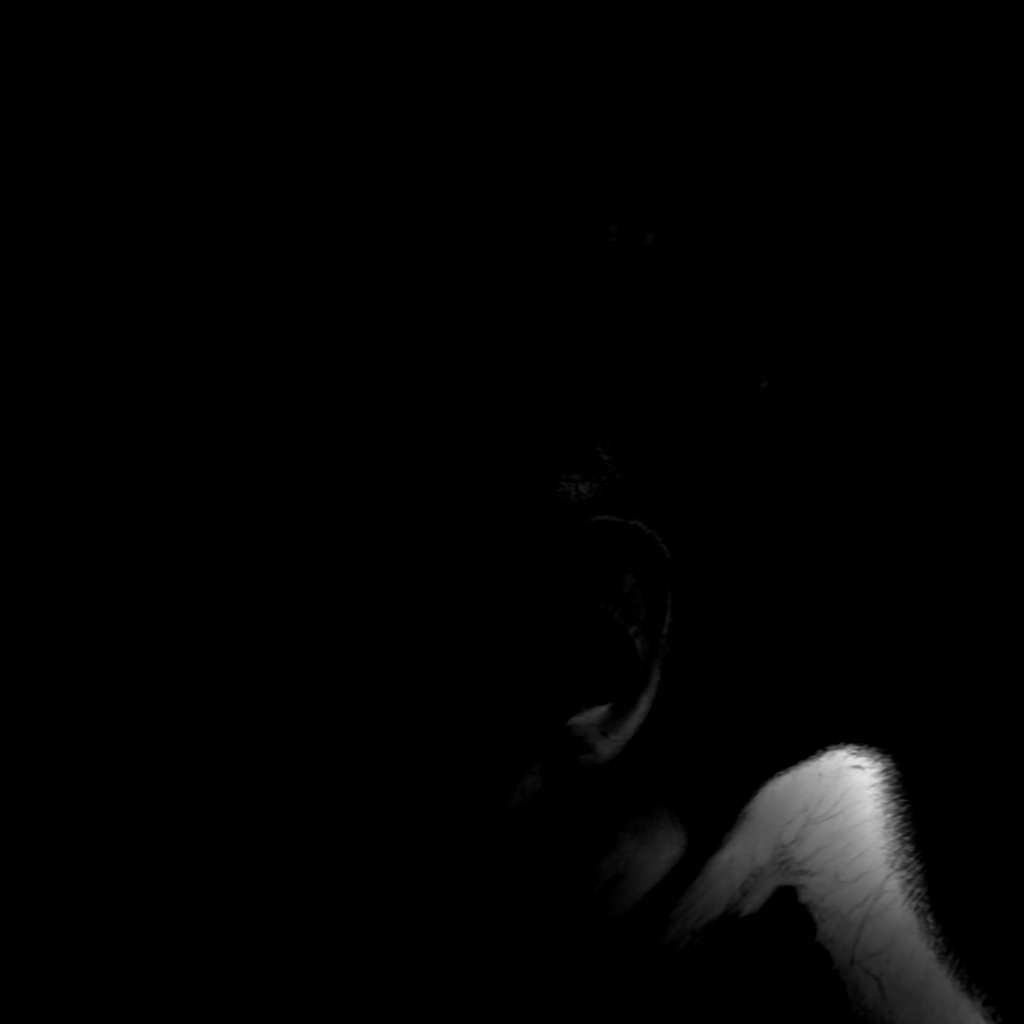
[im 25/25]
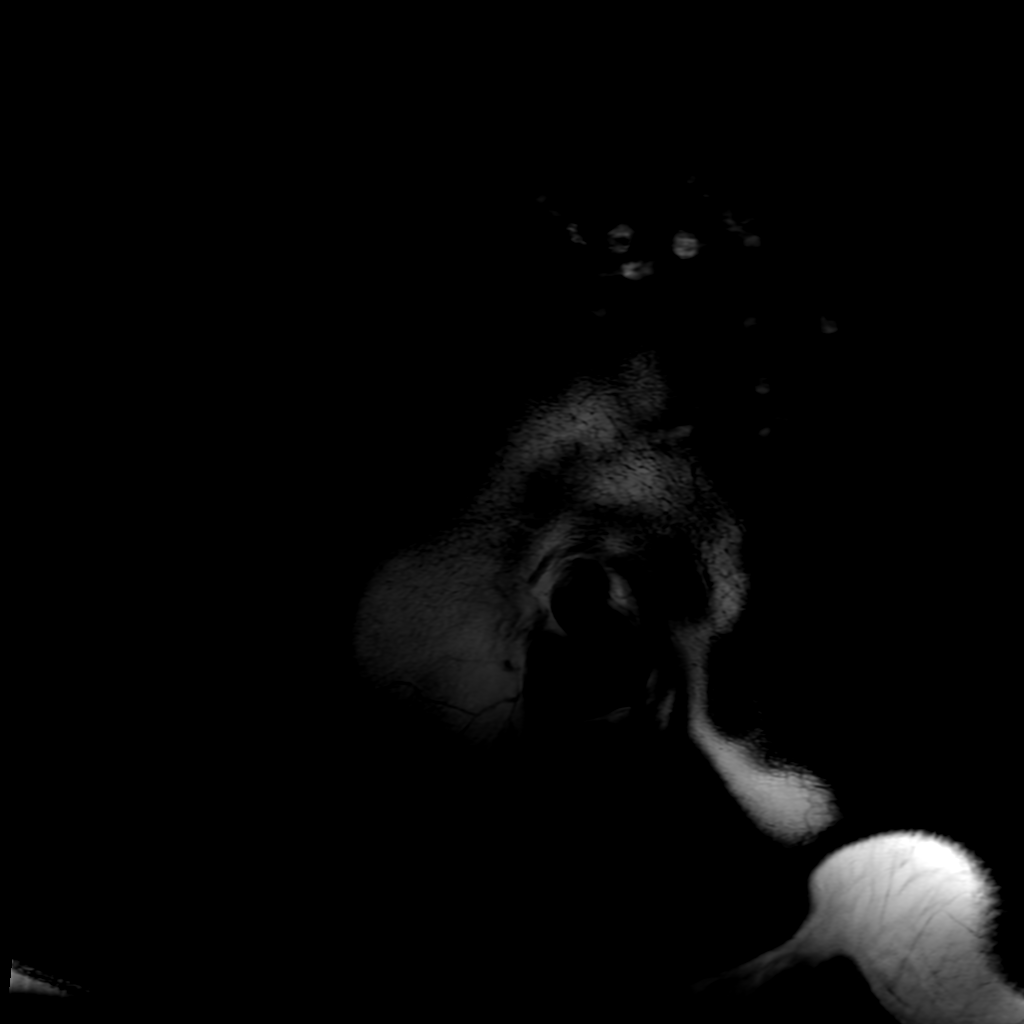

[Series 6: FLAIR · axial · 4.0mm · 0.45mm/px · z∈[-67,+82]mm · 3 of 35 slices shown (2 of 2)]
[im 1/35]
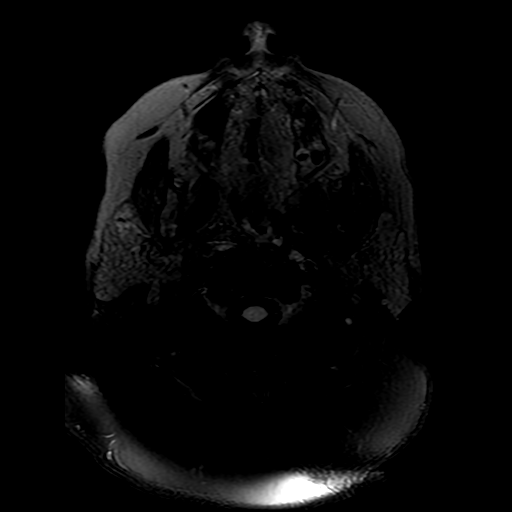
[im 18/35]
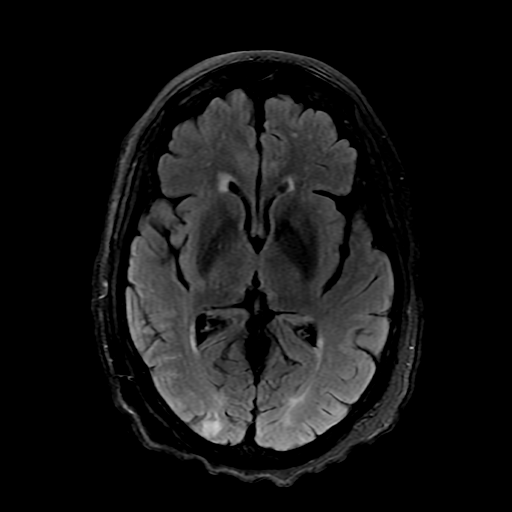
[im 35/35]
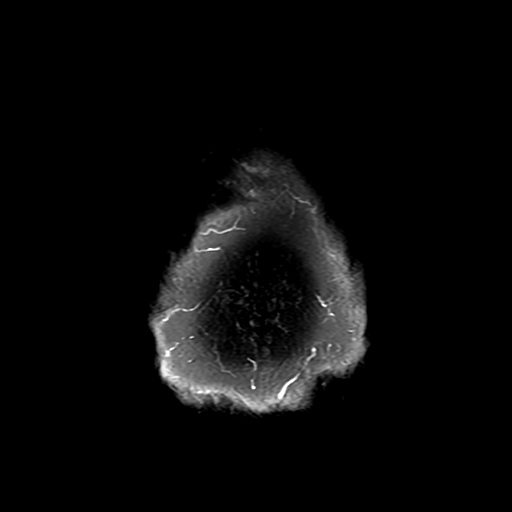

[Series 250: ADC · axial · 3.0mm · 0.94mm/px · z∈[-67,+80]mm · 4 of 45 slices shown (1 of 2)]
[im 1/45]
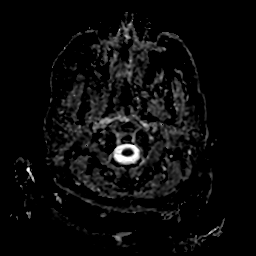
[im 15/45]
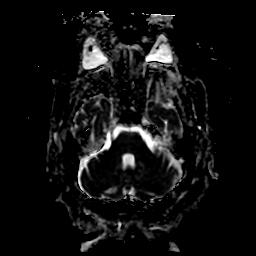
[im 30/45]
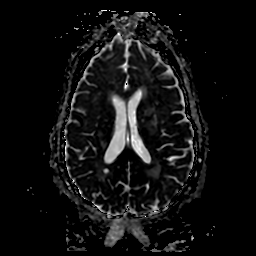
[im 45/45]
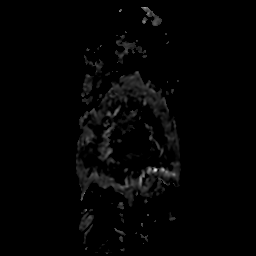

[Series 350: ADC · coronal · 4.0mm · 0.94mm/px · 3 of 36 slices shown (2 of 2)]
[im 1/36]
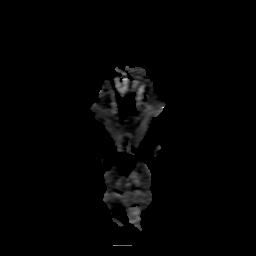
[im 18/36]
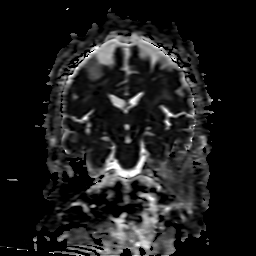
[im 36/36]
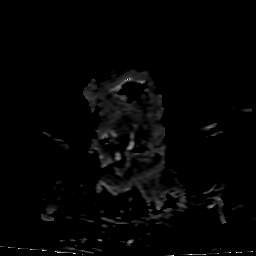

[27 of 48 positions shown; findings below may reference images not displayed]

FINDINGS: Brain: Patchy, scattered cortical restricted diffusion in the right
frontal operculum and tracking in the mid right middle and superior
Superimposed chronic right perirolandic cortical encephalomalacia
and confluent hemosiderin (series 7, image 75). Occasional right MCA
territory punctate white matter restricted diffusion.

Superimposed patchy right occipital pole cortical restricted
diffusion (series 2, image 25).

Cytotoxic edema in the affected areas with no acute hemorrhage or
mass effect.

Extensive superimposed bilateral cerebral white matter T2 and FLAIR
hyperintensity including areas most resembling cystic
encephalomalacia from previous white matter infarcts. Similar T2
heterogeneity in the right thalamus. Patchy chronic encephalomalacia
in the posterior cerebellum greater on the right. Chronic
microhemorrhage in the left cerebellum. Chronic microhemorrhage in
the right external capsule.

No midline shift, mass effect, evidence of mass lesion,
ventriculomegaly, extra-axial collection or acute intracranial
hemorrhage. Cervicomedullary junction and pituitary are within
normal limits.

Vascular: Major intracranial vascular flow voids are preserved.

Skull and upper cervical spine: Negative visible cervical spine.
Visualized bone marrow signal is within normal limits.

Sinuses/Orbits: Negative.

Other: Grossly normal visible internal auditory structures. Negative
visible scalp and face.
IMPRESSION: 1. Patchy acute infarcts in the Right MCA middle division, including
pre motor involvement.
And smaller acute to subacute patchy infarct in the Right PCA
territory, right occipital pole.
No acute hemorrhage or mass effect.

2. Underlying advanced chronic ischemic and small vessel disease.
This includes chronic right perirolandic encephalomalacia with
abundant hemosiderin, and chronic posterior cerebellar infarcts,
corresponding to susceptibility seen on earlier MRA.

## 2021-08-19 MED ORDER — INSULIN ASPART 100 UNIT/ML IJ SOLN
0.0000 [IU] | Freq: Three times a day (TID) | INTRAMUSCULAR | Status: DC
  Administered 2021-08-19 (×2): 5 [IU] via SUBCUTANEOUS
  Administered 2021-08-19: 10 [IU] via SUBCUTANEOUS
  Administered 2021-08-20: 13:00:00 3 [IU] via SUBCUTANEOUS
  Administered 2021-08-20: 17:00:00 5 [IU] via SUBCUTANEOUS
  Administered 2021-08-20 – 2021-08-21 (×2): 3 [IU] via SUBCUTANEOUS
  Administered 2021-08-21: 5 [IU] via SUBCUTANEOUS

## 2021-08-19 MED ORDER — ACETAMINOPHEN 325 MG PO TABS: 650.0000 mg | ORAL_TABLET | ORAL | Status: DC | PRN

## 2021-08-19 MED ORDER — ACETAMINOPHEN 650 MG RE SUPP: 650.0000 mg | RECTAL | Status: DC | PRN

## 2021-08-19 MED ORDER — STROKE: EARLY STAGES OF RECOVERY BOOK
Freq: Once | Status: AC
  Filled 2021-08-19: qty 1

## 2021-08-19 MED ORDER — ACETAMINOPHEN 160 MG/5ML PO SOLN: 650.0000 mg | ORAL | Status: DC | PRN

## 2021-08-19 MED ORDER — SENNOSIDES-DOCUSATE SODIUM 8.6-50 MG PO TABS: 1.0000 | ORAL_TABLET | Freq: Every evening | ORAL | Status: DC | PRN

## 2021-08-19 MED ORDER — PRAVASTATIN SODIUM 40 MG PO TABS
20.0000 mg | ORAL_TABLET | Freq: Every day | ORAL | Status: DC
  Administered 2021-08-19 – 2021-08-20 (×2): 20 mg via ORAL
  Filled 2021-08-19: qty 2
  Filled 2021-08-19 (×2): qty 1

## 2021-08-19 MED ORDER — NORTRIPTYLINE HCL 10 MG PO CAPS
10.0000 mg | ORAL_CAPSULE | Freq: Every day | ORAL | Status: DC
  Administered 2021-08-19 – 2021-08-20 (×2): 10 mg via ORAL
  Filled 2021-08-19 (×3): qty 1

## 2021-08-19 MED ORDER — ASPIRIN EC 81 MG PO TBEC
81.0000 mg | DELAYED_RELEASE_TABLET | Freq: Every day | ORAL | Status: DC
  Administered 2021-08-19 – 2021-08-20 (×2): 81 mg via ORAL
  Filled 2021-08-19 (×3): qty 1

## 2021-08-19 MED ORDER — CLOPIDOGREL BISULFATE 75 MG PO TABS
75.0000 mg | ORAL_TABLET | Freq: Every day | ORAL | Status: DC
  Administered 2021-08-20 – 2021-08-21 (×2): 75 mg via ORAL
  Filled 2021-08-19 (×2): qty 1

## 2021-08-19 MED ORDER — OXYCODONE HCL 5 MG PO TABS
5.0000 mg | ORAL_TABLET | Freq: Every day | ORAL | Status: DC | PRN
  Administered 2021-08-19 – 2021-08-20 (×2): 5 mg via ORAL
  Filled 2021-08-19 (×2): qty 1

## 2021-08-19 MED ORDER — GABAPENTIN 300 MG PO CAPS
900.0000 mg | ORAL_CAPSULE | Freq: Two times a day (BID) | ORAL | Status: DC
  Administered 2021-08-19 – 2021-08-20 (×3): 900 mg via ORAL
  Filled 2021-08-19 (×3): qty 3

## 2021-08-19 MED ORDER — HEPARIN SODIUM (PORCINE) 5000 UNIT/ML IJ SOLN
5000.0000 [IU] | Freq: Three times a day (TID) | INTRAMUSCULAR | Status: DC
  Administered 2021-08-19 – 2021-08-20 (×6): 5000 [IU] via SUBCUTANEOUS
  Filled 2021-08-19 (×7): qty 1

## 2021-08-19 MED ORDER — PERFLUTREN LIPID MICROSPHERE
1.0000 mL | INTRAVENOUS | Status: AC | PRN
  Administered 2021-08-19: 4 mL via INTRAVENOUS
  Filled 2021-08-19: qty 10

## 2021-08-19 MED ORDER — CLOPIDOGREL BISULFATE 300 MG PO TABS
300.0000 mg | ORAL_TABLET | Freq: Once | ORAL | Status: AC
  Administered 2021-08-19: 300 mg via ORAL
  Filled 2021-08-19: qty 1

## 2021-08-19 MED ORDER — INSULIN ASPART 100 UNIT/ML IJ SOLN: 0.0000 [IU] | Freq: Every day | INTRAMUSCULAR | Status: DC

## 2021-08-19 NOTE — Assessment & Plan Note (Signed)
Admit to observation telemetry bed. Neurology consulted. MRI/MRA Brain. Check echo. Check lipid panel. Heparin for DVT prophylaxis.asa 325 mg daily.

## 2021-08-19 NOTE — ED Notes (Signed)
Iv team at bedside  

## 2021-08-19 NOTE — ED Notes (Signed)
Flushed PIV and pt c/o pain. Noted infiltration. Order placed for IV team to come access port at this time

## 2021-08-19 NOTE — ED Provider Notes (Signed)
[facility] EMERGENCY DEPARTMENT Provider Note   CSN: [id number] Arrival date & time: 08/18/21  2339     History  Chief Complaint  Patient presents with   Cerebrovascular Accident    Sonya Poole is a 71 y.o. female.  The history is provided by the patient.  Cerebrovascular Accident This is a new problem. The current episode started 1 to 2 hours ago. The problem occurs constantly. The problem has been resolved. Pertinent negatives include no chest pain, no abdominal pain and no headaches. Nothing aggravates the symptoms. Nothing relieves the symptoms.  Patient with history of multiple myeloma, previous stroke presents with strokelike symptoms. She reports that she had difficulty speaking and slurred speech. She reports that all of her symptoms have resolved.  She reports some residual weakness from previous stroke on her left side that is unchanged She denies any other acute complaints.  She reports her previous stroke was in the year of 2022.  This was managed at [facility] Medications Prior to Admission medications   Not on File      Allergies    Lisinopril    Review of Systems   Review of Systems  Constitutional:  Negative for fever.  Eyes:  Negative for visual disturbance.  Cardiovascular:  Negative for chest pain.  Gastrointestinal:  Negative for abdominal pain.  Neurological:  Positive for speech difficulty. Negative for weakness and headaches.       Chronic paresthesias to her legs  All other systems reviewed and are negative.  Physical Exam Updated Vital Signs BP 138/76    Pulse (!) 108    Temp 98.2 F (36.8 C) (Oral)    Resp 14    Ht 1.727 m ('5\' 8"' )    Wt 117.9 kg    SpO2 99%    BMI 39.53 kg/m  Physical Exam CONSTITUTIONAL: Well developed/well nourished HEAD: Normocephalic/atraumatic EYES: EOMI/PERRL, no nystagmus,  no ptosis ENMT: Mucous membranes moist NECK: supple no meningeal signs CV: S1/S2 noted, no  murmurs/rubs/gallops noted LUNGS: Lungs are clear to auscultation bilaterally, no apparent distress ABDOMEN: soft, nontender, no rebound or guarding GU:no cva tenderness NEURO:Awake/alert, face symmetric, no arm or leg drift is noted Cranial nerves 3/4/5/6/02/04/09/11/12 tested and intact EXTREMITIES: pulses normal, full ROM SKIN: warm, color normal PSYCH: no abnormalities of mood noted  ED Results / Procedures / Treatments   Labs (all labs ordered are listed, but only abnormal results are displayed) Labs Reviewed  CBC - Abnormal; Notable for the following components:      Result Value   Hemoglobin 11.7 (*)    All other components within normal limits  COMPREHENSIVE METABOLIC PANEL - Abnormal; Notable for the following components:   Glucose, Bld 328 (*)    Creatinine, Ser 1.27 (*)    Total Protein 6.1 (*)    Albumin 3.2 (*)    GFR, Estimated 46 (*)    All other components within normal limits  RESP PANEL BY RT-PCR (FLU A&B, COVID) ARPGX2  ETHANOL  PROTIME-INR  APTT  DIFFERENTIAL  RAPID URINE DRUG SCREEN, HOSP PERFORMED  URINALYSIS, ROUTINE W REFLEX MICROSCOPIC    EKG EKG Interpretation  Date/Time:  Friday August 18 2021 23:52:06 EST Ventricular Rate:  116 PR Interval:  134 QRS Duration: 75 QT Interval:  295 QTC Calculation: 410 R Axis:   25 Text Interpretation: Sinus tachycardia Inferior infarct, old Lateral leads are also involved Confirmed by Ripley Fraise [phone]) on 08/19/2021 12:15:26 AM  Radiology CT  HEAD WO CONTRAST  Result Date: 08/19/2021 CLINICAL DATA:  Sudden onset of expressive aphasia. EXAM: CT HEAD WITHOUT CONTRAST TECHNIQUE: Contiguous axial images were obtained from the base of the skull through the vertex without intravenous contrast. RADIATION DOSE REDUCTION: This exam was performed according to the departmental dose-optimization program which includes automated exposure control, adjustment of the mA and/or kV according to patient size and/or use of  iterative reconstruction technique. COMPARISON:  None. FINDINGS: Brain: There is mild cerebral atrophy with widening of the extra-axial spaces and ventricular dilatation. There are areas of decreased attenuation within the white matter tracts of the supratentorial brain, consistent with microvascular disease changes. Small chronic right anterior parietal and right occipital lobe infarcts are seen. Vascular: No hyperdense vessel or unexpected calcification. Skull: Negative for acute fracture. Innumerable benign appearing subcentimeter round lytic areas are seen throughout the skull. Sinuses/Orbits: No acute finding. Other: None. IMPRESSION: 1. No acute intracranial abnormality. 2. Small chronic right anterior parietal and right occipital lobe infarcts. 3. Generalized cerebral atrophy Electronically Signed   By: Virgina Norfolk M.D.   On: 08/19/2021 01:09    Procedures Procedures    Medications Ordered in ED Medications - No data to display  ED Course/ Medical Decision Making/ A&P Clinical Course as of 08/19/21 0216  Sat Aug 19, 2021  0055 D/w dr Leonel Ramsay with neurology to see patient [DW]  0201 Discussed with Dr. Bridgett Larsson for admission.  Patient and family updated on plan [DW]    Clinical Course User Index [DW] Ripley Fraise, MD                           Medical Decision Making Amount and/or Complexity of Data Reviewed Labs: ordered. Radiology: ordered.  Risk Decision regarding hospitalization.   This patient presents to the ED for concern of stroke, this involves an extensive number of treatment options, and is a complaint that carries with it a high risk of complications and morbidity.  The differential diagnosis includes stroke, TIA, intracranial hemorrhage, subdural hematoma, subarachnoid hemorrhage, electrolyte abnormality  Comorbidities that complicate the patient evaluation: Patients presentation is complicated by their history of previous history of stroke  Social  Determinants of Health: Patients history of cancer increases the complexity of managing their presentation  Additional history obtained: Records reviewed previous admission documents from [facility]  Lab Tests: I Ordered, and personally interpreted labs.  The pertinent results include: Hyperglycemia  Imaging Studies ordered: I ordered imaging studies including CT scan head I independently visualized and interpreted imaging which showed no acute findings I agree with the radiologist interpretation  Cardiac Monitoring: The patient was maintained on a cardiac monitor.  I personally viewed and interpreted the cardiac monitor which showed an underlying rhythm of:  sinus tachycardia  Critical Interventions:       Neurology consultation  Consultations Obtained: I requested consultation with the admitting physician [facility], and discussed  findings as well as pertinent plan - they recommend: Admission  Reevaluation: After the interventions noted above, I reevaluated the patient and found that they have :improved  Complexity of problems addressed: Patients presentation is most consistent with  acute presentation with potential threat to life or bodily function      Disposition: After consideration of the diagnostic results and the patients response to treatment,  I feel that the patent would benefit from admission .            Final Clinical Impression(s) / ED Diagnoses Final  diagnoses:  TIA (transient ischemic attack)    Rx / DC Orders ED Discharge Orders     None         Ripley Fraise, MD 08/19/21 [phone]

## 2021-08-19 NOTE — Progress Notes (Signed)
Received consult for port access. No record in chart of port being placed at this facility. Secure chat to RN recommending chest xray to confirm tip placement.

## 2021-08-19 NOTE — ED Notes (Signed)
Admit provider sent request to place portable chest to confirm port placement for iv team

## 2021-08-19 NOTE — Progress Notes (Signed)
°  Echocardiogram 2D Echocardiogram has been performed.  Sonya Poole 08/19/2021, 1:29 PM

## 2021-08-19 NOTE — Assessment & Plan Note (Signed)
Chronic. 

## 2021-08-19 NOTE — ED Notes (Signed)
Admit provider at bedside 

## 2021-08-19 NOTE — Consult Note (Signed)
Neurology Consultation Reason for Consult: Transient word finding difficulty and slurred speech.  Referring Physician: Bridgett Larsson, E  CC: transient speech difficult  History is obtained from: patient   HPI: Sonya Poole is a 71 y.o. female with a history of hypertension, diabetes, previous stroke who presents with transient speech difficulties.  She states that she takes gabapentin, and has never had side effects from it before, but tonight when she took it about 20 minutes later, she was slurring her speech and unable to find her words.  It only lasted for about 15 minutes.  She denies any focal weakness or numbness.  She denies any visual change.  She is now back to baseline.     ROS: A 14 point ROS was performed and is negative except as noted in the HPI.  Past Medical History:  Diagnosis Date   Asthma    Cancer ([place])    Diabetes mellitus without complication ([place])    Hypertension    Immune deficiency disorder ([place])    Peripheral neuropathy    Stroke [facility])      History reviewed. No pertinent family history.   Social History:  reports that she has never smoked. She has never used smokeless tobacco. She reports current alcohol use of about 1.0 standard drink per week. She reports that she does not use drugs.   Exam: Current vital signs: BP 133/60    Pulse 98    Temp 98.2 F (36.8 C) (Oral)    Resp (!) 30    Ht 5\' 8"  (1.727 m)    Wt 117.9 kg    SpO2 95%    BMI 39.53 kg/m  Vital signs in last 24 hours: Temp:  [98.2 F (36.8 C)] 98.2 F (36.8 C) (01/20 2353) Pulse Rate:  [98-114] 98 (01/21 0300) Resp:  [14-30] 30 (01/21 0300) BP: (130-143)/(60-81) 133/60 (01/21 0300) SpO2:  [95 %-100 %] 95 % (01/21 0300) Weight:  [117.9 kg] 117.9 kg (01/20 2354)   Physical Exam  Constitutional: Obese Psych: Affect appropriate to situation Eyes: No scleral injection HENT: No OP obstruction MSK: no joint deformities.  Cardiovascular: Normal rate and regular rhythm.  Respiratory: Effort  normal, non-labored breathing GI: Soft.  No distension. There is no tenderness.  Skin: WDI  Neuro: Mental Status: Patient is awake, alert, oriented to person, place, month, year, and situation. Patient is able to give a clear and coherent history. No signs of aphasia or neglect Cranial Nerves: II: Visual Fields are full. Pupils are equal, round, and reactive to light.   III,IV, VI: EOMI without ptosis or diploplia.  V: Facial sensation is symmetric to temperature VII: Facial movement is symmetric.  VIII: hearing is intact to voice X: Uvula elevates symmetrically XI: Shoulder shrug is symmetric. XII: tongue is midline without atrophy or fasciculations.  Motor: Tone is normal. Bulk is normal. 5/5 strength was present in all four extremities.  Sensory: Sensation is symmetric to light touch and temperature in the arms and legs. Cerebellar: No clear ataxia     I have reviewed labs in epic and the results pertinent to this consultation are:  Creatinine 1.27, glucose 328  I have reviewed the images obtained: CT head-no acute findings, only chronic infarcts  Impression: 71 year old female with transient slurred speech and word finding difficulty.  If this were medication effect, I would have expected it to last longer, and also she has been on this dose of gabapentin for quite some time without side effects previously.  I would favor  treating this as TIA.  Recommendations: - HgbA1c, fasting lipid panel - MRI of the brain without contrast - Frequent neuro checks - Echocardiogram - CTA head and neck - Prophylactic therapy-Antiplatelet med: Aspirin - dose 81mg  and plavix 75mg  daily  after 300mg  load  - Risk factor modification - Telemetry monitoring - PT consult, OT consult, Speech consult - Stroke team to follow    Roland Rack, MD [facility] [phone]  If 7pm- 7am, please page neurology on call as listed in [place].

## 2021-08-19 NOTE — Evaluation (Signed)
Occupational Therapy Evaluation Patient Details Name: Sonya Poole MRN: [id number] DOB: [birth year] Today's Date: 08/19/2021   History of Present Illness Py is a 71 y.o. female who presented 08/18/21 with slurred speech lasting ~20 min. CT of head negative for acute intracranial abnormalities. Awaiting brain MRI, being worked up for possible TIA. PMH: multiple myeloma, DM, HTN, morbid obesity, peripheral neuropathy, CVA with residual L-sided weakness   Clinical Impression   Pt admitted with the above listed diagnosis, and demonstrates the below listed deficits.  She currently demonstrates generalized weakness, Rt UE weakness > Lt UE weakness, decreased activity tolerance, impaired coordination.   She requires set up to mod A for UB ADLs, and max - total A for LB ADLs which is not significantly different than her baseline.  She lives with her spouse and has a HHaide 5 days/week who assist with ADLs.  Recommend HHOT to ensure return to baseline.  All further OT needs can be addressed by [facility]  Acute OT will sign off at this time.       Recommendations for follow up therapy are one component of a multi-disciplinary discharge planning process, led by the attending physician.  Recommendations may be updated based on patient status, additional functional criteria and insurance authorization.   Follow Up Recommendations  Home health OT    Assistance Recommended at Discharge Intermittent Supervision/Assistance  Patient can return home with the following A little help with walking and/or transfers;A lot of help with bathing/dressing/bathroom;Assistance with cooking/housework;Direct supervision/assist for medications management;Direct supervision/assist for financial management;Assist for transportation;Help with stairs or ramp for entrance    Functional Status Assessment  Patient has had a recent decline in their functional status and demonstrates the ability to make significant improvements in function in  a reasonable and predictable amount of time.  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 and prior CVA Restrictions Weight Bearing Restrictions: No      Mobility Bed Mobility Overal bed mobility: Needs Assistance Bed Mobility: Supine to Sit     Supine to sit: Min assist          Transfers                   General transfer comment: deferred due to fatigue and had just gotten up with PT      Balance                                           ADL either performed or assessed with clinical judgement   ADL Overall ADL's : Needs assistance/impaired Eating/Feeding: Set up;Bed level   Grooming: Wash/dry hands;Wash/dry face;Oral care;Set up;Bed level   Upper Body Bathing: Moderate assistance;Sitting;Bed level   Lower Body Bathing: Maximal assistance;Sit to/from stand;Bed level   Upper Body Dressing : Moderate assistance;Sitting;Bed level   Lower Body Dressing: Total assistance;Bed level;Sit to/from stand               Functional mobility during ADLs: Min guard;Minimal assistance General ADL Comments: Pt fatigues rapidly with activity - reports she has not slept since yesterday am     Vision Baseline Vision/History: 1 Wears glasses Ability to See in Adequate Light: 0 Adequate Patient Visual Report: Blurring of vision Vision Assessment?: Yes Eye Alignment:  Within Functional Limits Ocular Range of Motion: Within Functional Limits Alignment/Gaze Preference: Within Defined Limits Tracking/Visual Pursuits: Able to track stimulus in all quads without difficulty Visual Fields: No apparent deficits Additional Comments: Pt reports she is due for eye exam and blurriness is not worse or changed since admission     Perception     Praxis      Pertinent  Vitals/Pain Pain Assessment Pain Assessment: Faces Faces Pain Scale: Hurts little more Pain Location: L leg chronic nerve pain Pain Descriptors / Indicators: Other (Comment) ("electric shock")     Hand Dominance Right   Extremity/Trunk Assessment Upper Extremity Assessment Upper Extremity Assessment: Generalized weakness;RUE deficits/detail;LUE deficits/detail RUE Deficits / Details: Rt shoulder AROM ~85* with HOB elevated.  strength grossly 2+/5; elbow grossly 3+/5 RUE Coordination: decreased fine motor;decreased gross motor LUE Deficits / Details: grossly 3+/5 throughout.  Reports long standing neuropathy Lt hand resulting in frequent drops LUE Sensation: history of peripheral neuropathy;decreased proprioception LUE Coordination: decreased fine motor;decreased gross motor   Lower Extremity Assessment Lower Extremity Assessment: Defer to PT evaluation   Cervical / Trunk Assessment Cervical / Trunk Assessment: Normal   Communication Communication Communication: Other (comment) (slightly slurred speech)   Cognition Arousal/Alertness: Awake/alert Behavior During Therapy: WFL for tasks assessed/performed Overall Cognitive Status: Within Functional Limits for tasks assessed                                       General Comments  [id number]    Exercises     Shoulder Instructions      Home Living Family/patient expects to be discharged to:: Private residence Living Arrangements: Spouse/significant other;Children (adult daughter who works, husband retired) Available Help at Discharge: Family;Available 24 hours/day;Personal care attendant Type of Home: House Home Access: Stairs to enter [employer] of Steps: 4 Entrance Stairs-Rails: Left (ascending) Home Layout: One level     Bathroom Shower/Tub: Tub/shower unit;Curtain   Bathroom Toilet: Standard (but places bedside commode over top of it) Bathroom Accessibility: Yes   Home Equipment: Rollator (4  wheels);Rolling Walker (2 wheels);Wheelchair - manual;BSC/3in1;Tub bench;Other (comment);Hand held shower head;Cane - quad;Adaptive equipment (planning to install grab bars in shower next week) Adaptive Equipment: Reacher Additional Comments: CNA comes M-F for 4 hrs each day      Prior Functioning/Environment Prior Level of Function : Needs assist       Physical Assist : ADLs (physical);Mobility (physical) Mobility (physical): Bed mobility;Transfers;Stairs ADLs (physical): Dressing;Bathing;Toileting;IADLs;Grooming Mobility Comments: Needs assist with bed mobility and provides guarding but does not assist with transfers. Able to ambulate household distances with rollator mod I. Husband assists pt on stairs. Denies any falls past 6 months. ADLs Comments: CNA and husband assists with bathing and dressing, including shirts and all. Occasionally needs assist to cut food. Does not cook or clean. Does not drive. Assistance for pericare. Granddaughter helps brush back of hair        OT Problem List: Decreased strength;Decreased activity tolerance;Decreased range of motion;Impaired balance (sitting and/or standing);Decreased coordination;Cardiopulmonary status limiting activity;Impaired UE functional use      OT Treatment/Interventions:      OT Goals(Current goals can be found in the care plan section) Acute Rehab OT Goals Patient Stated Goal: to go home and to get back to normal OT Goal Formulation: All assessment and education complete, DC therapy  OT Frequency:      Co-evaluation  AM-PAC OT "6 Clicks" Daily Activity     Outcome Measure Help from another person eating meals?: A Little Help from another person taking care of personal grooming?: A Little Help from another person toileting, which includes using toliet, bedpan, or urinal?: A Lot Help from another person bathing (including washing, rinsing, drying)?: A Lot Help from another person to put on and taking off  regular upper body clothing?: A Lot Help from another person to put on and taking off regular lower body clothing?: Total 6 Click Score: 13   End of Session Nurse Communication: Mobility status  Activity Tolerance: Patient limited by fatigue Patient left: in bed;with call bell/phone within reach  OT Visit Diagnosis: Unsteadiness on feet (R26.81);Muscle weakness (generalized) (M62.81)                Time: 5885-0277 OT Time Calculation (min): 12 min Charges:  OT General Charges $OT Visit: 1 Visit OT Evaluation $OT Eval Moderate Complexity: 1 Mod  Nilsa Nutting., OTR/L Acute Rehabilitation Services Pager [phone] Office [place], [place] 08/19/2021, 10:25 AM

## 2021-08-19 NOTE — Progress Notes (Signed)
71 year old African-American female with a prior history of multiple myeloma currently being treated by [facility], history of diabetes, hypertension, morbid obesity, peripheral neuropathy, history of stroke with residual left-sided weakness presents to the ER today with sudden onset of slurred speech.  This lasted about 20 minutes.  Patient states that she ran out of her Neurontin that she takes twice a day.  She states that she has been out of it for about 3 to 4 days.  Her husband was able to pick up her prescription today.  She took a dose this evening (900 mg).  About 15 minutes after she took it, she noticed that she was unable to speak.  She knew that she was unable to get any words out.  Symptoms lasted about 20 minutes.  She was able to get the attention of her husband.  He noticed that the patient was unable to speak.  He brought to the ER.  By the time she came to the ER, her slurred speech had resolved.  Patient denies any increase in her weakness in her left upper extremity and left lower extremity.  Patient walks with a walker and a wheelchair.   Work-up in the ER was unremarkable.  CT head showed an old right parietal and occipital stroke.  This was seen on MRI that she obtained at [facility] in March 2022.   CT also shows multiple round lytic lesions in her skull consistent with her history of multiple myeloma.   EDP is consulted neurology.   08/19/2021: Patient was seen and examined at her bedside in the ED.  Occupational Therapy was present at bedside.  She states she feels a lot better than when she first presented to the ED.  Slurring of speech is improved.  MRI brain revealed acute CVA.  Seen by stroke team, following.  Ongoing work-up for stroke and secondary CVA prevention.  Please refer to H&P for details of my partner Dr. Bridgett Larsson on 08/19/2021 for further details of the assessment and plan.  Updated the patient's husband via phone.  All questions answered to the  best of my ability.

## 2021-08-19 NOTE — Progress Notes (Signed)
Bilateral lower extremity venous duplex completed. Refer to "CV Proc" under chart review to view preliminary results.  08/19/2021 1:04 PM Kelby Aline., MHA, RVT, RDCS, RDMS

## 2021-08-19 NOTE — Assessment & Plan Note (Signed)
Chronic since CVA in 09-2020

## 2021-08-19 NOTE — Assessment & Plan Note (Signed)
Chronic. Followed by [facility].

## 2021-08-19 NOTE — ED Notes (Signed)
Returned from ct at this time ?

## 2021-08-19 NOTE — Assessment & Plan Note (Signed)
chronic

## 2021-08-19 NOTE — Progress Notes (Addendum)
STROKE TEAM PROGRESS NOTE   SUBJECTIVE (INTERVAL HISTORY) Her husband and daughter are at the bedside.  Patient lying in bed, still has left facial droop and mild left hemiparesis.  Plan for TEE and loop recorder tomorrow.  OBJECTIVE Temp:  [98.2 F (36.8 C)-98.8 F (37.1 C)] 98.8 F (37.1 C) (01/21 1200) Pulse Rate:  [98-114] 109 (01/21 1200) Cardiac Rhythm: Sinus tachycardia (01/21 0140) Resp:  [13-30] 19 (01/21 1200) BP: (118-143)/(56-98) 118/86 (01/21 1200) SpO2:  [95 %-100 %] 95 % (01/21 1200) Weight:  [117.9 kg] 117.9 kg (01/20 2354)  Recent Labs  Lab 08/19/21 0907  GLUCAP 209*   Recent Labs  Lab 08/19/21 0008  NA 140  K 4.1  CL 104  CO2 25  GLUCOSE 328*  BUN 10  CREATININE 1.27*  CALCIUM 9.0   Recent Labs  Lab 08/19/21 0008  AST 23  ALT 11  ALKPHOS 109  BILITOT 0.9  PROT 6.1*  ALBUMIN 3.2*   Recent Labs  Lab 08/19/21 0008  WBC 6.9  NEUTROABS 5.3  HGB 11.7*  HCT 36.2  MCV 85.2  PLT 162   No results for input(s): CKTOTAL, CKMB, CKMBINDEX, TROPONINI in the last 168 hours. Recent Labs    08/19/21 0008  LABPROT 13.4  INR 1.0   Recent Labs    08/19/21 0555  COLORURINE AMBER*  LABSPEC 1.021  PHURINE 5.0  GLUCOSEU >=500*  HGBUR NEGATIVE  BILIRUBINUR NEGATIVE  KETONESUR 5*  PROTEINUR 100*  NITRITE NEGATIVE  LEUKOCYTESUR LARGE*       Component Value Date/Time   CHOL 118 08/19/2021 0550   TRIG 118 08/19/2021 0550   HDL 47 08/19/2021 0550   CHOLHDL 2.5 08/19/2021 0550   VLDL 24 08/19/2021 0550   LDLCALC 47 08/19/2021 0550   No results found for: HGBA1C    Component Value Date/Time   LABOPIA NONE DETECTED 08/19/2021 0555   COCAINSCRNUR NONE DETECTED 08/19/2021 0555   LABBENZ NONE DETECTED 08/19/2021 0555   AMPHETMU NONE DETECTED 08/19/2021 0555   THCU NONE DETECTED 08/19/2021 0555   LABBARB NONE DETECTED 08/19/2021 0555    Recent Labs  Lab 08/19/21 0015  ETH <10    I have personally reviewed the radiological images below  and agree with the radiology interpretations.  CT HEAD WO CONTRAST  Result Date: 08/19/2021 CLINICAL DATA:  Sudden onset of expressive aphasia. EXAM: CT HEAD WITHOUT CONTRAST TECHNIQUE: Contiguous axial images were obtained from the base of the skull through the vertex without intravenous contrast. RADIATION DOSE REDUCTION: This exam was performed according to the departmental dose-optimization program which includes automated exposure control, adjustment of the mA and/or kV according to patient size and/or use of iterative reconstruction technique. COMPARISON:  None. FINDINGS: Brain: There is mild cerebral atrophy with widening of the extra-axial spaces and ventricular dilatation. There are areas of decreased attenuation within the white matter tracts of the supratentorial brain, consistent with microvascular disease changes. Small chronic right anterior parietal and right occipital lobe infarcts are seen. Vascular: No hyperdense vessel or unexpected calcification. Skull: Negative for acute fracture. Innumerable benign appearing subcentimeter round lytic areas are seen throughout the skull. Sinuses/Orbits: No acute finding. Other: None. IMPRESSION: 1. No acute intracranial abnormality. 2. Small chronic right anterior parietal and right occipital lobe infarcts. 3. Generalized cerebral atrophy Electronically Signed   By: Virgina Norfolk M.D.   On: 08/19/2021 01:09   MR ANGIO HEAD WO CONTRAST  Result Date: 08/19/2021 CLINICAL DATA:  71 year old female TIA. EXAM: MRA HEAD WITHOUT CONTRAST  TECHNIQUE: Angiographic images of the Circle of Willis were acquired using MRA technique without intravenous contrast. COMPARISON:  Noncontrast head CT 0054 hours today. FINDINGS: Anterior circulation: Antegrade flow in both ICA siphons. Mild siphon irregularity. No siphon stenosis. Normal bilateral posterior communicating artery origins. Patent carotid termini, MCA and ACA origins. Diminutive or absent anterior  communicating artery. Visible bilateral ACA branches are within normal limits. Bilateral MCA M1 segments and MCA bifurcations are patent. Visible bilateral MCA branches are within normal limits. Posterior circulation: Antegrade flow in the posterior circulation with codominant distal vertebral arteries. Patent left PICA and dominant appearing right AICA origins. No distal vertebral or vertebrobasilar junction stenosis. Patent basilar artery without stenosis. Patent SCA and PCA origins. Tortuous right PCA P1 segment. Fetal type left PCA origin. Right posterior communicating artery present but smaller. Bilateral PCA branches are within normal limits. Anatomic variants: Fetal type left PCA origin. Other: Abnormal susceptibility in the posterior right greater than left cerebellum, follow-up MRI brain pending at this time. No intracranial mass effect or ventriculomegaly. IMPRESSION: 1. Negative intracranial MRA. 2. Brain MRI pending, reported separately. Electronically Signed   By: Genevie Ann M.D.   On: 08/19/2021 08:23   MR ANGIO NECK WO CONTRAST  Result Date: 08/19/2021 CLINICAL DATA:  71 year old female with TIA. Patchy acute right MCA and acute to subacute right PCA territory infarcts on brain MRI today. EXAM: MRA NECK WITHOUT CONTRAST TECHNIQUE: Angiographic images of the neck were acquired using MRA technique without intravenous contrast. Carotid stenosis measurements (when applicable) are obtained utilizing NASCET criteria, using the distal internal carotid diameter as the denominator. COMPARISON:  Brain MRI and intracranial MRA today reported separately. FINDINGS: 3D time-of-flight imaging. Mild motion degradation. Limited detail of the aortic arch and proximal great vessels. Partially retropharyngeal course of the right carotid in the neck with preserved antegrade right carotid flow to the skull base. No evidence of hemodynamically significant stenosis. Antegrade left carotid flow in the neck to the skull base.  Tortuous left ICA distal to the bulb, but no evidence of hemodynamically significant stenosis. Codominant appearing cervical vertebral arteries with antegrade flow signal to the vertebrobasilar junction. Proximal vertebral artery detail limited by motion, but no evidence of hemodynamically significant stenosis. Visible intracranial arteries appear stable from the earlier circle-of-Willis MRA. IMPRESSION: 1. No evidence of hemodynamically significant cervical carotid or vertebral artery stenosis. 2. Tortuous carotid arteries, retropharyngeal on the right. Electronically Signed   By: Genevie Ann M.D.   On: 08/19/2021 09:05   MR BRAIN WO CONTRAST  Result Date: 08/19/2021 CLINICAL DATA:  71 year old female TIA. EXAM: MRI HEAD WITHOUT CONTRAST TECHNIQUE: Multiplanar, multiecho pulse sequences of the brain and surrounding structures were obtained without intravenous contrast. COMPARISON:  Head CT 0054 hours today. Intracranial MRA today. FINDINGS: Brain: Patchy, scattered cortical restricted diffusion in the right frontal operculum and tracking in the mid right middle and superior frontal gyrus, pre motor area (series 2 images 32 through 38). Superimposed chronic right perirolandic cortical encephalomalacia and confluent hemosiderin (series 7, image 75). Occasional right MCA territory punctate white matter restricted diffusion. Superimposed patchy right occipital pole cortical restricted diffusion (series 2, image 25). Cytotoxic edema in the affected areas with no acute hemorrhage or mass effect. Extensive superimposed bilateral cerebral white matter T2 and FLAIR hyperintensity including areas most resembling cystic encephalomalacia from previous white matter infarcts. Similar T2 heterogeneity in the right thalamus. Patchy chronic encephalomalacia in the posterior cerebellum greater on the right. Chronic microhemorrhage in the left cerebellum. Chronic microhemorrhage in  the right external capsule. No midline shift, mass  effect, evidence of mass lesion, ventriculomegaly, extra-axial collection or acute intracranial hemorrhage. Cervicomedullary junction and pituitary are within normal limits. Vascular: Major intracranial vascular flow voids are preserved. Skull and upper cervical spine: Negative visible cervical spine. Visualized bone marrow signal is within normal limits. Sinuses/Orbits: Negative. Other: Grossly normal visible internal auditory structures. Negative visible scalp and face. IMPRESSION: 1. Patchy acute infarcts in the Right MCA middle division, including pre motor involvement. And smaller acute to subacute patchy infarct in the Right PCA territory, right occipital pole. No acute hemorrhage or mass effect. 2. 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 This includes chronic right perirolandic encephalomalacia with abundant hemosiderin, and chronic posterior cerebellar infarcts, corresponding to susceptibility seen on earlier MRA. Electronically Signed   By: Genevie Ann M.D.   On: 08/19/2021 08:39   DG Chest Portable 1 View  Result Date: 08/19/2021 CLINICAL DATA:  Port placement. EXAM: PORTABLE CHEST 1 VIEW COMPARISON:  None. FINDINGS: The heart size and mediastinal contours are within normal limits. The lungs are clear without effusions or infiltrates. No pneumothorax is seen. No acute osseous abnormality. A right internal jugular chest port terminates over the right atrium. IMPRESSION: Right chest port terminates over the right atrium. Electronically Signed   By: Brett Fairy M.D.   On: 08/19/2021 04:55     PHYSICAL EXAM  Temp:  [98.2 F (36.8 C)-98.8 F (37.1 C)] 98.8 F (37.1 C) (01/21 1200) Pulse Rate:  [98-114] 109 (01/21 1200) Resp:  [13-30] 19 (01/21 1200) BP: (118-143)/(56-98) 118/86 (01/21 1200) SpO2:  [95 %-100 %] 95 % (01/21 1200) Weight:  [117.9 kg] 117.9 kg (01/20 2354)  General - Well nourished, well developed, in no apparent distress.  Ophthalmologic - fundi not  visualized due to noncooperation.  Cardiovascular - Regular rhythm and rate.  Mental Status -  Level of arousal and orientation to time, place, and person were intact. Language including expression, naming, repetition, comprehension was assessed and found intact. Fund of Knowledge was assessed and was intact.  Cranial Nerves II - XII - II - Visual field intact OU. III, IV, VI - Extraocular movements intact. V - Facial sensation intact bilaterally. VII - left facial droop VIII - Hearing & vestibular intact bilaterally. X - Palate elevates symmetrically. XI - Chin turning & shoulder shrug intact bilaterally. XII - Tongue protrusion intact.  Motor Strength - The patients strength was 4/5 RUE proximal and distal, 4-/5 LUE proximal and 3/5 distal hand grip. RLE 3/5 proximal and 4/5 distal ankle PF/DF, LLE 2/5 proximal and 4-/5 distal ankle PF/DF.  Bulk was normal and fasciculations were absent.   Motor Tone - Muscle tone was assessed at the neck and appendages and was normal.  Reflexes - The patients reflexes were symmetrical in all extremities and she had no pathological reflexes.  Sensory - Light touch, temperature/pinprick were assessed and were symmetrical.    Coordination - The patient had mild dysmetria on the R FTN but ataxic on the L FTN.  Tremor was absent.  Gait and Station - deferred.   ASSESSMENT/PLAN Ms. Zohar Laing is a 71 y.o. 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 Patient counseled to be compliant with her antithrombotic medications Ongoing aggressive stroke risk factor management Therapy recommendations:  HH PT/OT Disposition:  pending  Hx of stroke 08/2020 with left sided weakness and numbness admitted to [facility]. MRI showed right parietal postcentral gyrus infarct. CTA head and neck unremarkable. EF 70%. LDL 71 and A1C 9.4. she was discharged on ASA 325 and lipitor 40. Post stroke it was thought that her stroke was caused by MM meds of Pomalidomide, it was discontinued by her oncologist, and changed to cyclophosphamide.  She followed with cardiology at [facility] for [place] and found to have 40 brief episode of SVT. Since asymptomatic, no treatment offered. Walk with walker at home  SVT She followed with cardiology at [facility] for [place] and found to have 40 brief episode of SVT. Since asymptomatic, no treatment offered. Recommend loop recorder to rule out A. fib given currently embolic stroke  MM Followed with [facility]ology  Now on Daratumumab, cyclophosphamide and Dexamethasone Per pt, her MM is stable  Diabetes HgbA1c pending goal < 7.0 Uncontrolled hyperglycemia CBG monitoring SSI DM education and close PCP follow up  Hypertension Stable Long term BP goal normotensive  Hyperlipidemia Home meds:  pravastatin 20  LDL 47, goal < 70 Now on pravastatin 20, no need high intensity statin given LDL at goal Continue statin at discharge  Other Stroke Risk Factors Advanced age Obesity, Body mass index is 39.53 kg/m.   Other Active Problems Baseline left sided weakness due to neuropathy   Hospital day # 0   Rosalin Hawking, MD PhD Stroke Neurology 08/19/2021 12:40 PM    To contact Stroke Continuity provider, please refer to [email or web address] After hours, contact General Neurology

## 2021-08-19 NOTE — Progress Notes (Signed)
° ° °  [facility] has been requested to perform a transesophageal echocardiogram on Sonya Poole for stroke.  After careful review of history and examination, the risks and benefits of transesophageal echocardiogram have been explained including risks of esophageal damage, perforation (1:10,000 risk), bleeding, pharyngeal hematoma as well as other potential complications associated with conscious sedation including aspiration, arrhythmia, respiratory failure and death. Alternatives to treatment were discussed, questions were answered. Patient is willing to proceed.   Richardson Dopp, PA-C  08/19/2021 3:12 PM

## 2021-08-19 NOTE — ED Notes (Signed)
Provider at bedside

## 2021-08-19 NOTE — Subjective & Objective (Signed)
CC: slurred speech HPI: 71 year old African-American female with a prior history of multiple myeloma currently being treated by [facility], history of diabetes, hypertension, morbid obesity, peripheral neuropathy, history of stroke with residual left-sided weakness presents to the ER today with sudden onset of slurred speech.  This lasted about 20 minutes.  Patient states that she ran out of her Neurontin that she takes twice a day.  She states that she has been out of it for about 3 to 4 days.  Her husband was able to pick up her prescription today.  She took a dose this evening (900 mg).  About 15 minutes after she took it, she noticed that she was unable to speak.  She knew that she was unable to get any words out.  Symptoms lasted about 20 minutes.  She was able to get the attention of her husband.  He noticed that the patient was unable to speak.  He brought to the ER.  By the time she came to the ER, her slurred speech had resolved.  Patient denies any increase in her weakness in her left upper extremity and left lower extremity.  Patient walks with a walker and a wheelchair.  Work-up in the ER was unremarkable.  CT head showed an old right parietal and occipital stroke.  This was seen on MRI that she obtained at [facility] in March 2022.  CT also shows multiple round lytic lesions in her skull consistent with her history of multiple myeloma.  EDP is consulted neurology.  [facility] hospitalist contacted for admission.

## 2021-08-19 NOTE — Evaluation (Addendum)
Physical Therapy Evaluation Patient Details Name: Sonya Poole MRN: [id number] DOB: 1950/09/17 Today's Date: 08/19/2021  History of Present Illness  Pt is a 71 y.o. female who presented 08/18/21 with slurred speech lasting ~20 min. CT of head negative for acute intracranial abnormalities. MRI showed: Patchy acute infarcts in the Right MCA middle division, including pre motor involvement,And smaller acute to subacute patchy infarct in the Right PCA territory, right occipital pole. PMH: multiple myeloma, DM, HTN, morbid obesity, peripheral neuropathy, CVA with residual L-sided weakness   Clinical Impression  Pt presents with condition above and deficits mentioned below, see PT Problem List. PTA, she was living with her husband and adult daughter in a house with 4 STE. At baseline, pt needs assistance with bed mobility and stairs, but is able to ambulate household distances with rollator mod I. 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 She is requiring min guard-minA for transfers and short gait bouts with a RW at this time. As pt has good support at home available, recommending pt follow-up with HHPT. Will continue to follow acutely.     Recommendations for follow up therapy are one component of a multi-disciplinary discharge planning process, led by the attending physician.  Recommendations may be updated based on patient status, additional functional criteria and insurance authorization.  Follow Up Recommendations Home health PT    Assistance Recommended at Discharge Intermittent Supervision/Assistance  Patient can return home with the following  A little help with walking and/or transfers;A little help with bathing/dressing/bathroom;Assistance with cooking/housework;Assist for transportation;Help with stairs or ramp for entrance    Equipment  Recommendations None recommended by PT (has all needed DME)  Recommendations for Other Services       Functional Status Assessment Patient has had a recent decline in their functional status and demonstrates the ability to make significant improvements in function in a reasonable and predictable amount of time.     Precautions / Restrictions Precautions Precautions: Fall Precaution Comments: residual L-weakness and neuropathy from radiation and prior CVA Restrictions Weight Bearing Restrictions: No      Mobility  Bed Mobility Overal bed mobility: Needs Assistance Bed Mobility: Supine to Sit, Sit to Supine     Supine to sit: Min assist Sit to supine: Mod assist   General bed mobility comments: MinA for pt to pull up on PT's hands to ascend trunk and bring L hip to EOB. ModA to manage legs and trunk to supine.    Transfers Overall transfer level: Needs assistance Equipment used: Rolling walker (2 wheels) Transfers: Sit to/from Stand Sit to Stand: Min assist, Min guard           General transfer comment: Pt varying from requiring min guard-minA to transfer to stand from edge of stretcher, needing extra time to power up.    Ambulation/Gait Ambulation/Gait assistance: Min guard, Min assist Gait Distance (Feet): 30 Feet Assistive device: Rolling walker (2 wheels) Gait Pattern/deviations: Step-to pattern, Decreased step length - left, Decreased stride length, Decreased dorsiflexion - left, Trunk flexed, Wide base of support Gait velocity: reduced Gait velocity interpretation: <1.31 ft/sec, indicative of household ambulator   General Gait Details: Pt with L foot drag and lateral placement of foot throughout. Pt maintains flexed posture with slow gait, DOE 3/4. Min guard-minA for stability and cues to improve posture and rest as needed.  Stairs  Wheelchair Mobility    Modified Rankin (Stroke Patients Only) Modified Rankin (Stroke Patients  Only) Pre-Morbid Rankin Score: Moderate disability Modified Rankin: Moderately severe disability     Balance Overall balance assessment: Needs assistance Sitting-balance support: No upper extremity supported, Feet supported Sitting balance-Leahy Scale: Fair Sitting balance - Comments: Able to sit EOB statically with supervision.   Standing balance support: Bilateral upper extremity supported, Reliant on assistive device for balance, During functional activity Standing balance-Leahy Scale: Poor Standing balance comment: Reliant on UE support                             Pertinent Vitals/Pain Pain Assessment Pain Assessment: 0-10 Pain Score: 6  Pain Location: L leg chronic nerve pain Pain Descriptors / Indicators: Other (Comment) ("electric shock") Pain Intervention(s): Limited activity within patient's tolerance, Monitored during session    [place] expects to be discharged to:: Private residence Living Arrangements: Spouse/significant other;Children (adult daughter who works, husband retired) Available Help at Discharge: Family;Available 24 hours/day;Personal care attendant Type of Home: House Home Access: Stairs to enter Entrance Stairs-Rails: Left (ascending) Entrance Stairs-Number of Steps: 4   Home Layout: One level Home Equipment: Rollator (4 wheels);Rolling Walker (2 wheels);Wheelchair - manual;BSC/3in1;Tub bench;Other (comment);Hand held shower head;Cane - quad;Adaptive equipment (planning to install grab bars in shower next week) Additional Comments: CNA comes M-F for 4 hrs each day    Prior Function Prior Level of Function : Needs assist       Physical Assist : ADLs (physical);Mobility (physical) Mobility (physical): Bed mobility;Transfers;Stairs ADLs (physical): Dressing;Bathing;Toileting;IADLs;Grooming Mobility Comments: Needs assist with bed mobility and provides guarding but does not assist with transfers. Able to ambulate  household distances with rollator mod I. Husband assists pt on stairs. Denies any falls past 6 months. ADLs Comments: CNA and husband assists with bathing and dressing, including shirts and all. Occasionally needs assist to cut food. Does not cook or clean. Does not drive. Assistance for pericare. Granddaughter helps brush back of hair     Hand Dominance   Dominant Hand: Right    Extremity/Trunk Assessment   Upper Extremity Assessment Upper Extremity Assessment: Generalized weakness;RUE deficits/detail;LUE deficits/detail RUE Deficits / Details: Rt shoulder AROM ~85* with HOB elevated.  strength grossly 2+/5; elbow grossly 3+/5 RUE Coordination: decreased fine motor;decreased gross motor LUE Deficits / Details: grossly 3+/5 throughout.  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    Cervical / Trunk Assessment Cervical / Trunk Assessment: Normal  Communication   Communication: Other (comment) (slightly slurred speech)  Cognition Arousal/Alertness: Awake/alert Behavior During Therapy: WFL for tasks assessed/performed Overall Cognitive Status: Within Functional Limits for tasks assessed                                          General Comments General comments (skin integrity, edema, etc.): [id number]    Exercises     Assessment/Plan    PT Assessment Patient needs continued PT services  PT Problem List Decreased strength;Decreased balance;Decreased  activity tolerance;Decreased range of motion;Decreased mobility;Decreased  coordination;Impaired sensation       PT Treatment Interventions DME instruction;Gait training;Stair training;Functional mobility training;Therapeutic activities;Therapeutic exercise;Balance training;Neuromuscular re-education;Patient/family education    PT Goals (Current goals can be found in the Care Plan section)  Acute Rehab PT Goals Patient Stated Goal: to improve PT Goal Formulation: With patient Time For Goal Achievement: 09/02/21 Potential to Achieve Goals: Good    Frequency Min 4X/week     Co-evaluation               AM-PAC PT "6 Clicks" Mobility  Outcome Measure Help needed turning from your back to your side while in a flat bed without using bedrails?: A Little Help needed moving from lying on your back to sitting on the side of a flat bed without using bedrails?: A Little Help needed moving to and from a bed to a chair (including a wheelchair)?: A Little Help needed standing up from a chair using your arms (e.g., wheelchair or bedside chair)?: A Little Help needed to walk in hospital room?: A Little Help needed climbing 3-5 steps with a railing? : A Little 6 Click Score: 18    End of Session Equipment Utilized During Treatment: Gait belt Activity Tolerance: Patient tolerated treatment well;Patient limited by fatigue Patient left: in bed;with call bell/phone within reach Nurse Communication: Mobility status PT Visit Diagnosis: Unsteadiness on feet (R26.81);Other abnormalities of gait and mobility (R26.89);Muscle weakness (generalized) (M62.81);Difficulty in walking, not elsewhere classified (R26.2);Other symptoms and signs involving the nervous system (R29.898);Hemiplegia and hemiparesis Hemiplegia - Right/Left: Left Hemiplegia - dominant/non-dominant: Non-dominant Hemiplegia - caused by: Cerebral infarction    Time: 5329-9242 PT Time Calculation (min) (ACUTE ONLY): 28 min   Charges:   PT Evaluation $PT Eval Moderate Complexity: 1 Mod PT  Treatments $Therapeutic Activity: 8-22 mins        Moishe Spice, PT, DPT Acute Rehabilitation Services  Pager: [phone] Office: [phone]   Orvan Falconer 08/19/2021, 10:48 AM

## 2021-08-19 NOTE — Assessment & Plan Note (Signed)
Check A1c. Add SSI. 

## 2021-08-19 NOTE — ED Notes (Signed)
X-ray at bedside

## 2021-08-19 NOTE — Assessment & Plan Note (Signed)
Awaiting med rec tech to complete home med list.

## 2021-08-19 NOTE — ED Notes (Signed)
Pt moved to room 3 per charge nurse request

## 2021-08-19 NOTE — ED Notes (Signed)
Patient transported to MRI 

## 2021-08-19 NOTE — H&P (Signed)
History and Physical    Sonya Poole SLH:[id number] DOB: October 27, 1950 DOA: 08/18/2021  PCP: System, Provider Not In   Patient coming from: Home  I have personally briefly reviewed patient's old medical records in [place]  CC: slurred speech HPI: 71 year old African-American female with a prior history of multiple myeloma currently being treated by [facility], history of diabetes, hypertension, morbid obesity, peripheral neuropathy, history of stroke with residual left-sided weakness presents to the ER today with sudden onset of slurred speech.  This lasted about 20 minutes.  Patient states that she ran out of her Neurontin that she takes twice a day.  She states that she has been out of it for about 3 to 4 days.  Her husband was able to pick up her prescription today.  She took a dose this evening (900 mg).  About 15 minutes after she took it, she noticed that she was unable to speak.  She knew that she was unable to get any words out.  Symptoms lasted about 20 minutes.  She was able to get the attention of her husband.  He noticed that the patient was unable to speak.  He brought to the ER.  By the time she came to the ER, her slurred speech had resolved.  Patient denies any increase in her weakness in her left upper extremity and left lower extremity.  Patient walks with a walker and a wheelchair.  Work-up in the ER was unremarkable.  CT head showed an old right parietal and occipital stroke.  This was seen on MRI that she obtained at [facility] in March 2022.  CT also shows multiple round lytic lesions in her skull consistent with her history of multiple myeloma.  EDP is consulted neurology.  [facility] hospitalist contacted for admission.   ED Course: CT head showed old CVA  Review of Systems:  Review of Systems  Constitutional: Negative.  Negative for chills, fever and weight loss.  HENT: Negative.  Negative for ear pain, hearing loss and tinnitus.   Eyes:  Negative.  Negative for blurred vision, double vision and photophobia.  Respiratory: Negative.  Negative for cough, hemoptysis and sputum production.   Cardiovascular: Negative.  Negative for chest pain, palpitations and orthopnea.  Gastrointestinal: Negative.  Negative for heartburn, nausea and vomiting.  Genitourinary: Negative.  Negative for dysuria, frequency and urgency.  Musculoskeletal: Negative.  Negative for back pain, myalgias and neck pain.  Skin: Negative.   Neurological:  Positive for speech change.       Chronic left sided hemiparesis  Aphasia lasted about 20 mins  Endo/Heme/Allergies: Negative.   Psychiatric/Behavioral: Negative.    All other systems reviewed and are negative.  Past Medical History:  Diagnosis Date   Asthma    Cancer ([place])    Diabetes mellitus without complication ([place])    Hypertension    Immune deficiency disorder ([place])    Peripheral neuropathy    Stroke [facility])     Past Surgical History:  Procedure Laterality Date   CHOLECYSTECTOMY     intestine remove       reports that she has never smoked. She has never used smokeless tobacco. She reports current alcohol use of about 1.0 standard drink per week. She reports that she does not use drugs.  Allergies  Allergen Reactions   Lisinopril Swelling    History reviewed. No pertinent family history.  Prior to Admission medications   Not on File    Physical Exam: Vitals:  08/18/21 2353 08/18/21 2354 08/19/21 0100 08/19/21 0200  BP: (!) 143/81  138/76 130/81  Pulse: (!) 114  (!) 108 (!) 103  Resp: '18  14 20  ' Temp: 98.2 F (36.8 C)     TempSrc: Oral     SpO2: 96%  99% 97%  Weight:  117.9 kg    Height:  '5\' 8"'  (1.727 m)      Physical Exam Vitals and nursing note reviewed.  Constitutional:      General: She is not in acute distress.    Appearance: Normal appearance. She is obese. She is not ill-appearing, toxic-appearing or diaphoretic.  HENT:     Head: Normocephalic and atraumatic.      Nose: Nose normal. No rhinorrhea.  Eyes:     General: No scleral icterus. Cardiovascular:     Rate and Rhythm: Normal rate and regular rhythm.     Heart sounds: Murmur heard.     Comments: Soft SEM LUSB Pulmonary:     Effort: Pulmonary effort is normal. No respiratory distress.     Breath sounds: Normal breath sounds. No wheezing or rales.  Abdominal:     General: Bowel sounds are normal. There is no distension.     Palpations: Abdomen is soft.     Tenderness: There is no abdominal tenderness. There is no guarding or rebound.  Musculoskeletal:     Right lower leg: No edema.     Left lower leg: No edema.  Skin:    General: Skin is warm and dry.     Capillary Refill: Capillary refill takes less than 2 seconds.  Neurological:     Mental Status: She is alert and oriented to person, place, and time.     Comments: Mild decrease +4/5 left hand grip strength. No dysarthria     Labs on Admission: I have personally reviewed following labs and imaging studies  CBC: Recent Labs  Lab 08/19/21 0008  WBC 6.9  NEUTROABS 5.3  HGB 11.7*  HCT 36.2  MCV 85.2  PLT 672   Basic Metabolic Panel: Recent Labs  Lab 08/19/21 0008  NA 140  K 4.1  CL 104  CO2 25  GLUCOSE 328*  BUN 10  CREATININE 1.27*  CALCIUM 9.0   GFR: Estimated Creatinine Clearance: 57.2 mL/min (A) (by C-G formula based on SCr of 1.27 mg/dL (H)). Liver Function Tests: Recent Labs  Lab 08/19/21 0008  AST 23  ALT 11  ALKPHOS 109  BILITOT 0.9  PROT 6.1*  ALBUMIN 3.2*   No results for input(s): LIPASE, AMYLASE in the last 168 hours. No results for input(s): AMMONIA in the last 168 hours. Coagulation Profile: Recent Labs  Lab 08/19/21 0008  INR 1.0   Cardiac Enzymes: No results for input(s): CKTOTAL, CKMB, CKMBINDEX, TROPONINI in the last 168 hours. BNP (last 3 results) No results for input(s): PROBNP in the last 8760 hours. HbA1C: No results for input(s): HGBA1C in the last 72 hours. CBG: No results  for input(s): GLUCAP in the last 168 hours. Lipid Profile: No results for input(s): CHOL, HDL, LDLCALC, TRIG, CHOLHDL, LDLDIRECT in the last 72 hours. Thyroid Function Tests: No results for input(s): TSH, T4TOTAL, FREET4, T3FREE, THYROIDAB in the last 72 hours. Anemia Panel: No results for input(s): VITAMINB12, FOLATE, FERRITIN, TIBC, IRON, RETICCTPCT in the last 72 hours. Urine analysis: No results found for: COLORURINE, APPEARANCEUR, LABSPEC, PHURINE, GLUCOSEU, HGBUR, BILIRUBINUR, [place], PROTEINUR, UROBILINOGEN, NITRITE, LEUKOCYTESUR  Radiological Exams on Admission: I have personally reviewed images CT HEAD WO  CONTRAST  Result Date: 08/19/2021 CLINICAL DATA:  Sudden onset of expressive aphasia. EXAM: CT HEAD WITHOUT CONTRAST TECHNIQUE: Contiguous axial images were obtained from the base of the skull through the vertex without intravenous contrast. RADIATION DOSE REDUCTION: This exam was performed according to the departmental dose-optimization program which includes automated exposure control, adjustment of the mA and/or kV according to patient size and/or use of iterative reconstruction technique. COMPARISON:  None. FINDINGS: Brain: There is mild cerebral atrophy with widening of the extra-axial spaces and ventricular dilatation. There are areas of decreased attenuation within the white matter tracts of the supratentorial brain, consistent with microvascular disease changes. Small chronic right anterior parietal and right occipital lobe infarcts are seen. Vascular: No hyperdense vessel or unexpected calcification. Skull: Negative for acute fracture. Innumerable benign appearing subcentimeter round lytic areas are seen throughout the skull. Sinuses/Orbits: No acute finding. Other: None. IMPRESSION: 1. No acute intracranial abnormality. 2. Small chronic right anterior parietal and right occipital lobe infarcts. 3. Generalized cerebral atrophy Electronically Signed   By: Virgina Norfolk M.D.    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 Neurology consulted. MRI/MRA Brain. Check echo. Check lipid panel. Heparin for DVT prophylaxis.asa 325 mg daily.  Hypertension associated with diabetes ([place]) Awaiting med rec tech to complete home med list.  Morbid obesity with BMI of 45.0-49.9, adult (HCC) Chronic.  Multiple myeloma (HCC) Chronic. Followed by [facility]  Type 2 diabetes mellitus without complication (HCC) Check A1c. Add SSI.  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

## 2021-08-19 NOTE — ED Notes (Signed)
Per charge nurse move pt to room 3 at this time

## 2021-08-20 DIAGNOSIS — G8194 Hemiplegia, unspecified affecting left nondominant side: Secondary | ICD-10-CM | POA: Diagnosis present

## 2021-08-20 DIAGNOSIS — N1832 Chronic kidney disease, stage 3b: Secondary | ICD-10-CM | POA: Diagnosis present

## 2021-08-20 DIAGNOSIS — G459 Transient cerebral ischemic attack, unspecified: Secondary | ICD-10-CM | POA: Diagnosis present

## 2021-08-20 DIAGNOSIS — Z7982 Long term (current) use of aspirin: Secondary | ICD-10-CM | POA: Diagnosis not present

## 2021-08-20 DIAGNOSIS — R4781 Slurred speech: Secondary | ICD-10-CM | POA: Diagnosis present

## 2021-08-20 DIAGNOSIS — Z20822 Contact with and (suspected) exposure to covid-19: Secondary | ICD-10-CM | POA: Diagnosis present

## 2021-08-20 DIAGNOSIS — R29702 NIHSS score 2: Secondary | ICD-10-CM | POA: Diagnosis present

## 2021-08-20 DIAGNOSIS — I340 Nonrheumatic mitral (valve) insufficiency: Secondary | ICD-10-CM | POA: Diagnosis not present

## 2021-08-20 DIAGNOSIS — I152 Hypertension secondary to endocrine disorders: Secondary | ICD-10-CM

## 2021-08-20 DIAGNOSIS — Z6841 Body Mass Index (BMI) 40.0 and over, adult: Secondary | ICD-10-CM | POA: Diagnosis not present

## 2021-08-20 DIAGNOSIS — Z79899 Other long term (current) drug therapy: Secondary | ICD-10-CM | POA: Diagnosis not present

## 2021-08-20 DIAGNOSIS — E1165 Type 2 diabetes mellitus with hyperglycemia: Secondary | ICD-10-CM | POA: Diagnosis present

## 2021-08-20 DIAGNOSIS — Q2112 Patent foramen ovale: Secondary | ICD-10-CM | POA: Diagnosis not present

## 2021-08-20 DIAGNOSIS — C9000 Multiple myeloma not having achieved remission: Secondary | ICD-10-CM | POA: Diagnosis present

## 2021-08-20 DIAGNOSIS — R471 Dysarthria and anarthria: Secondary | ICD-10-CM | POA: Diagnosis present

## 2021-08-20 DIAGNOSIS — I639 Cerebral infarction, unspecified: Secondary | ICD-10-CM | POA: Diagnosis not present

## 2021-08-20 DIAGNOSIS — E1159 Type 2 diabetes mellitus with other circulatory complications: Secondary | ICD-10-CM | POA: Diagnosis not present

## 2021-08-20 DIAGNOSIS — I69354 Hemiplegia and hemiparesis following cerebral infarction affecting left non-dominant side: Secondary | ICD-10-CM | POA: Diagnosis not present

## 2021-08-20 DIAGNOSIS — E785 Hyperlipidemia, unspecified: Secondary | ICD-10-CM | POA: Diagnosis present

## 2021-08-20 DIAGNOSIS — Z794 Long term (current) use of insulin: Secondary | ICD-10-CM | POA: Diagnosis not present

## 2021-08-20 DIAGNOSIS — I10 Essential (primary) hypertension: Secondary | ICD-10-CM | POA: Diagnosis present

## 2021-08-20 DIAGNOSIS — I63411 Cerebral infarction due to embolism of right middle cerebral artery: Secondary | ICD-10-CM | POA: Diagnosis present

## 2021-08-20 DIAGNOSIS — E1122 Type 2 diabetes mellitus with diabetic chronic kidney disease: Secondary | ICD-10-CM | POA: Diagnosis present

## 2021-08-20 DIAGNOSIS — R29810 Facial weakness: Secondary | ICD-10-CM | POA: Diagnosis present

## 2021-08-20 DIAGNOSIS — Z7984 Long term (current) use of oral hypoglycemic drugs: Secondary | ICD-10-CM | POA: Diagnosis not present

## 2021-08-20 DIAGNOSIS — E1142 Type 2 diabetes mellitus with diabetic polyneuropathy: Secondary | ICD-10-CM | POA: Diagnosis present

## 2021-08-20 LAB — GLUCOSE, CAPILLARY
Glucose-Capillary: 157 mg/dL — ABNORMAL HIGH (ref 70–99)
Glucose-Capillary: 190 mg/dL — ABNORMAL HIGH (ref 70–99)
Glucose-Capillary: 240 mg/dL — ABNORMAL HIGH (ref 70–99)
Glucose-Capillary: 145 mg/dL — ABNORMAL HIGH (ref 70–99)

## 2021-08-20 NOTE — Progress Notes (Signed)
PROGRESS NOTE  Sonya Poole QJF:[id number] DOB: 12/12/1950 DOA: 08/18/2021 PCP: System, Provider Not In  HPI/Recap of past 8 hours:  71 year old African-American female with a prior history of multiple myeloma currently being treated by [facility], history of type II diabetes, hypertension, morbid obesity, peripheral neuropathy, history of stroke with residual left-sided weakness presents to the ER with sudden onset of slurred speech, lasting about 20 minutes.  Her husband brought her to the ER.  By the time she presented to the ER, her slurred speech had resolved.  Admitted for stroke work-up.    Work-up reviewed CT head showing an old right parietal and occipital stroke.  This was seen on MRI that she obtained at [facility] in March 2022.   CT also shows multiple round lytic lesions in her skull consistent with her history of multiple myeloma.  Seen by neurology/stroke team.  MRI brain revealed acute CVA.  She was started on dual antiplatelets and statin.     08/20/2021: Patient was seen at her bedside this morning.  She has no new complaints.  There were no acute events overnight.  She was evaluated by PT OT with recommendation for home health PT OT.  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 2D Echo LVEF 60 to 89%, grade 1 diastolic dysfunction. LDL 47, at goal, goal less than 70 HgbA1c, pending. UDS negative. Heparin subq for VTE  prophylaxis aspirin 325 mg daily prior to admission, now on aspirin 81 mg daily and clopidogrel 75 mg daily DAPT for 3 weeks and then plavix alone. Patient counseled to be compliant with her antithrombotic medications Ongoing aggressive stroke risk factor management Therapy recommendations: Home health PT OT. Disposition: Likely to home with home health services once neurology/stroke team signs off.   Hx of stroke 08/2020 with left sided weakness and numbness. MRI showed right parietal postcentral gyrus infarct. CTA head and neck unremarkable. EF 70%. LDL 71 and A1C 9.4. she was discharged on ASA 325 and lipitor 40. Post stroke it was thought that her stroke was caused by MM meds of Pomalidomide, it was discontinued by her oncologist, and changed to cyclophosphamide.  She followed with cardiology for Ziopatch and found to have 40 brief episode of SVT. Since asymptomatic, no treatment offered. Walk with walker at home   Small melanoma Followed with [facility] oncology  Now on Daratumumab, cyclophosphamide and Dexamethasone Per pt, her MM is stable  Type 2 diabetes with hyperglycemia. Hemoglobin A1c is pending. Continues insulin sliding scale.  Ambulatory dysfunction poststroke PT OT assessed and recommended home health PT OT Continue fall precautions   CKD 3B Appears to be at her baseline creatinine 1.2 with GFR 46 Continue to avoid nephrotoxic agents, dehydration and hypotension.  Chronic peripheral neuropathy Continue home gabapentin  Hypertension BP is at goal Continue home regimen    Code Status: Full code  Family Communication: None at bedside.  Updated her husband via phone on 08/19/2021.  Disposition Plan: Plan to discharge  home with home health services. ° ° °Consultants: °Neurology/stroke team. ° °Procedures: °None. ° °Antimicrobials: °None. ° °DVT prophylaxis: °Subcu heparin 3 times daily ° °Status is: Inpatient ° °Patient requires at least 2 midnights for further  evaluation and treatment of present condition. ° ° ° ° ° °Objective: °Vitals:  ° 08/19/21 2301 08/20/21 0350 08/20/21 0743 08/20/21 1213  °BP: 136/70 113/80 140/75 124/63  °Pulse: (!) 104 95 100 94  °Resp: 18 18 16 18  °Temp: 98.2 °F (36.8 °C) 98.4 °F (36.9 °C) 98.4 °F (36.9 °C) 98.6 °F (37 °C)  °TempSrc: Oral Oral Oral Oral  °SpO2: 100% 95% 99% 98%  °Weight:      °Height:      ° ° °Intake/Output Summary (Last 24 hours) at 08/20/2021 1410 °Last data filed at 08/20/2021 0500 °Gross per 24 hour  °Intake 360 ml  °Output 650 ml  °Net -290 ml  ° °Filed Weights  ° 08/18/21 2354 08/19/21 1408  °Weight: 117.9 kg 121.4 kg  ° ° °Exam: ° °General: 70 y.o. year-old female well developed well nourished in no acute distress.  Alert and oriented x3. °Cardiovascular: Regular rate and rhythm with no rubs or gallops.  No thyromegaly or JVD noted.   °Respiratory: Clear to auscultation with no wheezes or rales. Good inspiratory effort. °Abdomen: Soft nontender nondistended with normal bowel sounds x4 quadrants. °Musculoskeletal: No lower extremity edema bilaterally.   °Skin: No ulcerative lesions noted or rashes, °Psychiatry: Mood is appropriate for condition and setting ° ° °Data Reviewed: °CBC: °Recent Labs  °Lab 08/19/21 °0008  °WBC 6.9  °NEUTROABS 5.3  °HGB 11.7*  °HCT 36.2  °MCV 85.2  °PLT 162  ° °Basic Metabolic Panel: °Recent Labs  °Lab 08/19/21 °0008  °NA 140  °K 4.1  °CL 104  °CO2 25  °GLUCOSE 328*  °BUN 10  °CREATININE 1.27*  °CALCIUM 9.0  ° °GFR: °Estimated Creatinine Clearance: 58.2 mL/min (A) (by C-G formula based on SCr of 1.27 mg/dL (H)). °Liver Function Tests: °Recent Labs  °Lab 08/19/21 °0008  °AST 23  °ALT 11  °ALKPHOS 109  °BILITOT 0.9  °PROT 6.1*  °ALBUMIN 3.2*  ° °No results for input(s): LIPASE, AMYLASE in the last 168 hours. °No results for input(s): AMMONIA in the last 168 hours. °Coagulation Profile: °Recent Labs  °Lab 08/19/21 °0008  °INR 1.0  ° °Cardiac Enzymes: °No results for input(s): CKTOTAL, CKMB,  CKMBINDEX, TROPONINI in the last 168 hours. °BNP (last 3 results) °No results for input(s): PROBNP in the last 8760 hours. °HbA1C: °No results for input(s): HGBA1C in the last 72 hours. °CBG: °Recent Labs  °Lab 08/19/21 °1202 08/19/21 °1649 08/19/21 °2111 08/20/21 °0616 08/20/21 °1214  °GLUCAP 221* 307* 112* 157* 190*  ° °Lipid Profile: °Recent Labs  °  08/19/21 °0550  °CHOL 118  °HDL 47  °LDLCALC 47  °TRIG 118  °CHOLHDL 2.5  ° °Thyroid Function Tests: °No results for input(s): TSH, T4TOTAL, FREET4, T3FREE, THYROIDAB in the last 72 hours. °Anemia Panel: °No results for input(s): VITAMINB12, FOLATE, FERRITIN, TIBC, IRON, RETICCTPCT in the last 72 hours. °Urine analysis: °   °Component Value Date/Time  ° COLORURINE AMBER (A) 08/19/2021 0555  ° APPEARANCEUR CLOUDY (A) 08/19/2021 0555  ° LABSPEC 1.021 08/19/2021 0555  ° PHURINE 5.0 08/19/2021 0555  ° GLUCOSEU >=500 (A) 08/19/2021 0555  ° HGBUR NEGATIVE 08/19/2021 0555  ° BILIRUBINUR NEGATIVE 08/19/2021 0555  ° KETONESUR 5 (A) 08/19/2021 0555  ° PROTEINUR 100 (A) 08/19/2021 0555  ° NITRITE NEGATIVE 08/19/2021   0555  ° LEUKOCYTESUR LARGE (A) 08/19/2021 0555  ° °Sepsis Labs: °@LABRCNTIP(procalcitonin:4,lacticidven:4) ° °) °Recent Results (from the past 240 hour(s))  °Resp Panel by RT-PCR (Flu A&B, Covid) Nasopharyngeal Swab     Status: None  ° Collection Time: 08/19/21 12:15 AM  ° Specimen: Nasopharyngeal Swab; Nasopharyngeal(NP) swabs in vial transport medium  °Result Value Ref Range Status  ° SARS Coronavirus 2 by RT PCR NEGATIVE NEGATIVE Final  °  Comment: (NOTE) °SARS-CoV-2 target nucleic acids are NOT DETECTED. ° °The SARS-CoV-2 RNA is generally detectable in upper respiratory °specimens during the acute phase of infection. The lowest °concentration of SARS-CoV-2 viral copies this assay can detect is °138 copies/mL. A negative result does not preclude SARS-Cov-2 °infection and should not be used as the sole basis for treatment or °other patient management decisions. A  negative result may occur with  °improper specimen collection/handling, submission of specimen other °than nasopharyngeal swab, presence of viral mutation(s) within the °areas targeted by this assay, and inadequate number of viral °copies(<138 copies/mL). A negative result must be combined with °clinical observations, patient history, and epidemiological °information. The expected result is Negative. ° °Fact Sheet for Patients:  °https://www.fda.gov/media/152166/download ° °Fact Sheet for Healthcare Providers:  °https://www.fda.gov/media/152162/download ° °This test is no t yet approved or cleared by the [place] FDA and  °has been authorized for detection and/or diagnosis of SARS-CoV-2 by °FDA under an Emergency Use Authorization (EUA). This EUA will remain  °in effect (meaning this test can be used) for the duration of the °COVID-19 declaration under Section 564(b)(1) of the Act, 21 °U.S.C.section 360bbb-3(b)(1), unless the authorization is terminated  °or revoked sooner.  ° ° °  ° Influenza A by PCR NEGATIVE NEGATIVE Final  ° Influenza B by PCR NEGATIVE NEGATIVE Final  °  Comment: (NOTE) °The Xpert Xpress SARS-CoV-2/FLU/RSV plus assay is intended as an aid °in the diagnosis of influenza from Nasopharyngeal swab specimens and °should not be used as a sole basis for treatment. Nasal washings and °aspirates are unacceptable for Xpert Xpress SARS-CoV-2/FLU/RSV °testing. ° °Fact Sheet for Patients: °https://www.fda.gov/media/152166/download ° °Fact Sheet for Healthcare Providers: °https://www.fda.gov/media/152162/download ° °This test is not yet approved or cleared by the [place] FDA and °has been authorized for detection and/or diagnosis of SARS-CoV-2 by °FDA under an Emergency Use Authorization (EUA). This EUA will remain °in effect (meaning this test can be used) for the duration of the °COVID-19 declaration under Section 564(b)(1) of the Act, 21 U.S.C. °section 360bbb-3(b)(1), unless the authorization  is terminated or °revoked. ° °Performed at [facility], [address] , [place] °[address] °  °  ° ° °Studies: °No results found. ° °Scheduled Meds: ° aspirin EC  81 mg Oral Daily  ° clopidogrel  75 mg Oral Daily  ° gabapentin  900 mg Oral BID  ° heparin  5,000 Units Subcutaneous Q8H  ° insulin aspart  0-15 Units Subcutaneous TID WC  ° insulin aspart  0-5 Units Subcutaneous QHS  ° nortriptyline  10 mg Oral QHS  ° pravastatin  20 mg Oral Daily  ° ° °Continuous Infusions: ° ° LOS: 0 days  ° ° ° °Carole N Hall, MD °[facility] °Pager [phone] ° °If 7PM-7AM, please contact night-coverage [email or web address] °Password [email or web address] °08/20/2021, 2:10 PM  °  °

## 2021-08-20 NOTE — Care Management Obs Status (Signed)
[facility] NOTIFICATION   Patient Details  Name: Sonya Poole MRN: [id number] Date of Birth: 11/26/1950   Medicare Observation Status Notification Given:  Yes    Carles Collet, RN 08/20/2021, 1:41 PM

## 2021-08-20 NOTE — TOC Initial Note (Signed)
Transition of Care [facility]) - Initial/Assessment Note    Patient Details  Name: Sonya Poole MRN: [id number] Date of Birth: 1950-10-22  Transition of [facility] CM/SW Contact:    Carles Collet, RN Phone Number: 08/20/2021, 1:43 PM  Clinical Narrative:                Damaris Schooner w patient and family at bedside. Patient active w [facility], will need orders for PT OT SLP for DC. Requests 3/1, order placed will notify [employer] for delivery to room today. Family states that patient will DC via private car    Expected Discharge Plan: [place] Barriers to Discharge: Continued Medical Work up   Patient Goals and CMS Choice Patient states their goals for this hospitalization and ongoing recovery are:: to go home CMS Medicare.gov Compare Post Acute Care list provided to:: Patient Choice offered to / list presented to : Patient  Expected Discharge Plan and Services Expected Discharge Plan: [place]   Discharge Planning Services: CM Consult Post Acute Care Choice: Home Health                             HH Arranged: PT, OT, Speech Therapy [place] Agency: [place] Date [place]: 08/20/21 Time HH Agency Contacted: 1343 Representative spoke with at [place]: [place] Arrangements/Services   Lives with:: Spouse                   Activities of Daily Living Home Assistive Devices/Equipment: Environmental consultant (specify type) ADL Screening (condition at time of admission) Patient's cognitive ability adequate to safely complete daily activities?: Yes Is the patient deaf or have difficulty hearing?: No Does the patient have difficulty seeing, even when wearing glasses/contacts?: No Does the patient have difficulty concentrating, remembering, or making decisions?: No Patient able to express need for assistance with ADLs?: Yes Does the patient have difficulty dressing or bathing?: No Independently performs ADLs?: No Walks in Home:  Independent with device (comment) Does the patient have difficulty walking or climbing stairs?: Yes Weakness of Legs: Left Weakness of Arms/Hands: Left  Permission Sought/Granted                  Emotional Assessment              Admission diagnosis:  TIA (transient ischemic attack) [G45.9] Patient Active Problem List   Diagnosis Date Noted   TIA (transient ischemic attack) 08/19/2021   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Phone: [phone]: [phone]    Social Determinants of Health (SDOH) Interventions    Readmission Risk Interventions No flowsheet data found.

## 2021-08-21 ENCOUNTER — Encounter (HOSPITAL_COMMUNITY)
Admission: EM | Disposition: A | Discharge status: Home Health | Payer: Self-pay | Source: Home / Self Care | Attending: Internal Medicine

## 2021-08-21 ENCOUNTER — Inpatient Hospital Stay (HOSPITAL_COMMUNITY): Payer: 59 | Admitting: Certified Registered"

## 2021-08-21 ENCOUNTER — Inpatient Hospital Stay (HOSPITAL_COMMUNITY)
Admission: EM | Disposition: A | Discharge status: Home Health | Payer: Self-pay | Source: Home / Self Care | Attending: Internal Medicine

## 2021-08-21 ENCOUNTER — Inpatient Hospital Stay (HOSPITAL_COMMUNITY): Payer: 59

## 2021-08-21 ENCOUNTER — Encounter (HOSPITAL_COMMUNITY): Payer: Self-pay | Admitting: Internal Medicine

## 2021-08-21 DIAGNOSIS — I639 Cerebral infarction, unspecified: Secondary | ICD-10-CM

## 2021-08-21 DIAGNOSIS — Q2112 Patent foramen ovale: Secondary | ICD-10-CM

## 2021-08-21 DIAGNOSIS — I340 Nonrheumatic mitral (valve) insufficiency: Secondary | ICD-10-CM

## 2021-08-21 DIAGNOSIS — E11649 Type 2 diabetes mellitus with hypoglycemia without coma: Secondary | ICD-10-CM

## 2021-08-21 HISTORY — PX: TEE WITHOUT CARDIOVERSION: SHX5443

## 2021-08-21 HISTORY — PX: BUBBLE STUDY: SHX6837

## 2021-08-21 HISTORY — PX: LOOP RECORDER INSERTION: EP1214

## 2021-08-21 LAB — GLUCOSE, CAPILLARY
Glucose-Capillary: 214 mg/dL — ABNORMAL HIGH (ref 70–99)
Glucose-Capillary: 196 mg/dL — ABNORMAL HIGH (ref 70–99)
Glucose-Capillary: 219 mg/dL — ABNORMAL HIGH (ref 70–99)
Glucose-Capillary: 239 mg/dL — ABNORMAL HIGH (ref 70–99)

## 2021-08-21 LAB — HEMOGLOBIN A1C
Hgb A1c MFr Bld: 9.9 % — ABNORMAL HIGH (ref 4.8–5.6)
Mean Plasma Glucose: 237 mg/dL

## 2021-08-21 SURGERY — ECHOCARDIOGRAM, TRANSESOPHAGEAL
Anesthesia: Monitor Anesthesia Care

## 2021-08-21 SURGERY — LOOP RECORDER INSERTION

## 2021-08-21 MED ORDER — CHLORHEXIDINE GLUCONATE CLOTH 2 % EX PADS
6.0000 | MEDICATED_PAD | Freq: Every day | CUTANEOUS | Status: DC
  Administered 2021-08-21: 6 via TOPICAL

## 2021-08-21 MED ORDER — BUTAMBEN-TETRACAINE-BENZOCAINE 2-2-14 % EX AERO
INHALATION_SPRAY | CUTANEOUS | Status: DC | PRN
  Administered 2021-08-21: 2 via TOPICAL

## 2021-08-21 MED ORDER — SODIUM CHLORIDE 0.9 % IV SOLN
INTRAVENOUS | Status: AC | PRN
  Administered 2021-08-21: 500 mL via INTRAVENOUS

## 2021-08-21 MED ORDER — CLOPIDOGREL BISULFATE 75 MG PO TABS: 75.0000 mg | ORAL_TABLET | Freq: Every day | ORAL | 0 refills | Status: DC

## 2021-08-21 MED ORDER — ASPIRIN 81 MG PO TBEC: 81.0000 mg | DELAYED_RELEASE_TABLET | Freq: Every day | ORAL | 0 refills | Status: AC

## 2021-08-21 MED ORDER — PRAVASTATIN SODIUM 20 MG PO TABS: 20.0000 mg | ORAL_TABLET | Freq: Every day | ORAL | 0 refills | Status: AC

## 2021-08-21 MED ORDER — HEPARIN SOD (PORK) LOCK FLUSH 100 UNIT/ML IV SOLN
500.0000 [IU] | INTRAVENOUS | Status: DC | PRN
  Filled 2021-08-21: qty 5

## 2021-08-21 MED ORDER — LIDOCAINE HCL (CARDIAC) PF 100 MG/5ML IV SOSY
PREFILLED_SYRINGE | INTRAVENOUS | Status: DC | PRN
  Administered 2021-08-21: 100 mg via INTRAVENOUS

## 2021-08-21 MED ORDER — LIDOCAINE-EPINEPHRINE 1 %-1:100000 IJ SOLN
INTRAMUSCULAR | Status: AC
  Filled 2021-08-21: qty 1

## 2021-08-21 MED ORDER — LIDOCAINE-EPINEPHRINE 1 %-1:100000 IJ SOLN
INTRAMUSCULAR | Status: DC | PRN
  Administered 2021-08-21: 10 mL

## 2021-08-21 MED ORDER — PROPOFOL 500 MG/50ML IV EMUL
INTRAVENOUS | Status: DC | PRN
  Administered 2021-08-21: 150 ug/kg/min via INTRAVENOUS

## 2021-08-21 SURGICAL SUPPLY — 2 items
PACK LOOP INSERTION (CUSTOM PROCEDURE TRAY) ×2 IMPLANT
SYSTEM MONITOR REVEAL LINQ II (Prosthesis & Implant Heart) ×1 IMPLANT

## 2021-08-21 NOTE — Progress Notes (Addendum)
Inpatient Diabetes Program Recommendations  AACE/ADA: New Consensus Statement on Inpatient Glycemic Control (2015)  Target Ranges:  Prepandial:   less than 140 mg/dL      Peak postprandial:   less than 180 mg/dL (1-2 hours)      Critically ill patients:  140 - 180 mg/dL   Lab Results  Component Value Date   GLUCAP 219 (H) 08/21/2021   HGBA1C 9.9 (H) 08/19/2021    Review of Glycemic Control  Latest Reference Range & Units 08/21/21 06:22 08/21/21 11:16 08/21/21 14:10  Glucose-Capillary 70 - 99 mg/dL 214 (H) 196 (H) 219 (H)  (H): Data is abnormally high Diabetes history: Type 2 DM Outpatient Diabetes medications: Soliqua 38 units PRN, Metformin 500 mg BID Current orders for Inpatient glycemic control: Novolog 0-15 units TID & HS  Inpatient Diabetes Program Recommendations:    Consider adding Semglee 12 units QHS.   Thanks, Bronson Curb, MSN, RNC-OB Diabetes Coordinator [phone] (8a-5p)

## 2021-08-21 NOTE — Anesthesia Preprocedure Evaluation (Addendum)
Anesthesia Evaluation  Patient identified by MRN, date of birth, ID band Patient awake    Reviewed: Allergy & Precautions, NPO status , Patient's Chart, lab work & pertinent test results  Airway Mallampati: III  TM Distance: >3 FB Neck ROM: Full    Dental  (+) Missing, Loose, Dental Advisory Given,    Pulmonary asthma ,    Pulmonary exam normal breath sounds clear to auscultation       Cardiovascular hypertension, Pt. on medications Normal cardiovascular exam Rhythm:Regular Rate:Normal  TTE 2023 1. Left ventricular ejection fraction, by estimation, is 60 to 65%. The  left ventricle has normal function. The left ventricle has no regional  wall motion abnormalities. There is mild left ventricular hypertrophy.  Left ventricular diastolic parameters  are consistent with Grade I diastolic dysfunction (impaired relaxation).  2. Right ventricular systolic function is normal. The right ventricular  size is normal.  3. The mitral valve is normal in structure. No evidence of mitral valve  regurgitation.  4. The aortic valve was not well visualized. Aortic valve regurgitation  is not visualized. No aortic stenosis is present.    Neuro/Psych TIACVA (left sided weakness, slurred speech), Residual Symptoms negative psych ROS   GI/Hepatic negative GI ROS, Neg liver ROS,   Endo/Other  diabetes, Type 2, Insulin Dependent, Oral Hypoglycemic AgentsMorbid obesity (BMI 41)  Renal/GU negative Renal ROS  negative genitourinary   Musculoskeletal negative musculoskeletal ROS (+)   Abdominal   Peds  Hematology negative hematology ROS (+) H/o multiple myeloma   Anesthesia Other Findings Acute CVA  Reproductive/Obstetrics                            Anesthesia Physical Anesthesia Plan  ASA: 3  Anesthesia Plan: MAC   Post-op Pain Management: Minimal or no pain anticipated   Induction: Intravenous  PONV  Risk Score and Plan: Propofol infusion and Treatment may vary due to age or medical condition  Airway Management Planned: Natural Airway  Additional Equipment:   Intra-op Plan:   Post-operative Plan:   Informed Consent: I have reviewed the patients History and Physical, chart, labs and discussed the procedure including the risks, benefits and alternatives for the proposed anesthesia with the patient or authorized representative who has indicated his/her understanding and acceptance.     Dental advisory given  Plan Discussed with: CRNA  Anesthesia Plan Comments:         Anesthesia Quick Evaluation

## 2021-08-21 NOTE — Progress Notes (Signed)
Physical Therapy Treatment Patient Details Name: Sonya Poole MRN: [id number] DOB: February 16, 1951 Today's Date: 08/21/2021   History of Present Illness Pt is a 71 y.o. female who presented 08/18/21 with slurred speech lasting ~20 min. MRI revealed Patchy acute infarcts in the Right MCA middle division, including  pre motor involvement, and smaller acute to subacute patchy infarct in the Right PCA territory, right occipital pole. PMH significant for multiple myeloma, DM, HTN, morbid obesity, peripheral neuropathy, CVA with residual L-sided weakness.    PT Comments    Pt progressing towards physical therapy goals. Was able to ambulate in room with RW for support however fatigues quickly. Required 1 seated rest break before sitting in the recliner. Husband present throughout session and pt/husband agree they will be able to manage at home with [facility] therapies to follow up. It appears that pt's gait deficits are mildly improved this session. Will continue to follow and progress as able per POC.     Recommendations for follow up therapy are one component of a multi-disciplinary discharge planning process, led by the attending physician.  Recommendations may be updated based on patient status, additional functional criteria and insurance authorization.  Follow Up Recommendations  Home health PT     Assistance Recommended at Discharge Intermittent Supervision/Assistance  Patient can return home with the following A little help with walking and/or transfers;A little help with bathing/dressing/bathroom;Assistance with cooking/housework;Assist for transportation;Help with stairs or ramp for entrance   Equipment Recommendations  BSC/3in1    Recommendations for Other Services       Precautions / Restrictions Precautions Precautions: Fall Precaution Comments: residual L-weakness and neuropathy from radiation and prior CVA Restrictions Weight Bearing Restrictions: No     Mobility  Bed Mobility Overal  bed mobility: Needs Assistance Bed Mobility: Supine to Sit     Supine to sit: Min guard     General bed mobility comments: Increased time but no assist required to transition to EOB. HOB slightly elevated. Min use of rails.    Transfers Overall transfer level: Needs assistance Equipment used: Rolling walker (2 wheels) Transfers: Sit to/from Stand Sit to Stand: Mod assist, Min assist           General transfer comment: Mod assist from lower bed height, and min assist from significantly elevated bed height. VC's for hand placement on seated surface for safety.    Ambulation/Gait Ambulation/Gait assistance: Min guard, Min assist Gait Distance (Feet): 25 Feet Assistive device: Rolling walker (2 wheels) Gait Pattern/deviations: Step-to pattern, Decreased step length - left, Decreased stride length, Decreased dorsiflexion - left, Trunk flexed, Wide base of support Gait velocity: Decreased Gait velocity interpretation: <1.31 ft/sec, indicative of household ambulator   General Gait Details: Pt with decreased step length on the L and lateral placement of foot throughout gait cycle. Pt maintains flexed posture with slow gait, DOE 3/4 and significant perspiration. Min guard-minA for stability and cues to improve posture and rest as needed.   Stairs             Wheelchair Mobility    Modified Rankin (Stroke Patients Only) Modified Rankin (Stroke Patients Only) Pre-Morbid Rankin Score: Moderate disability Modified Rankin: Moderately severe disability     Balance Overall balance assessment: Needs assistance Sitting-balance support: No upper extremity supported, Feet supported Sitting balance-Leahy Scale: Fair Sitting balance - Comments: Able to sit EOB statically with supervision.   Standing balance support: Bilateral upper extremity supported, Reliant on assistive device for balance, During functional activity Standing balance-Leahy Scale: Poor Standing  balance comment:  Reliant on UE support                            Cognition Arousal/Alertness: Awake/alert Behavior During Therapy: WFL for tasks assessed/performed Overall Cognitive Status: Within Functional Limits for tasks assessed                                          Exercises      General Comments        Pertinent Vitals/Pain Pain Assessment Pain Assessment: Faces Faces Pain Scale: Hurts a little bit Pain Location: General discomfort with mobility Pain Descriptors / Indicators: Grimacing Pain Intervention(s): Limited activity within patient's tolerance, Repositioned, Monitored during session    [place] expects to be discharged to:: Private residence Living Arrangements: Spouse/significant other;Children Available Help at Discharge: Family;Available 24 hours/day;Personal care attendant Type of Home: House Home Access: Stairs to enter Entrance Stairs-Rails: Left (ascending) Entrance Stairs-Number of Steps: 4   Home Layout: One level Home Equipment: Rollator (4 wheels);Rolling Walker (2 wheels);Wheelchair - manual;BSC/3in1;Tub bench;Other (comment);Hand held shower head;Cane - quad;Adaptive equipment (planning to install grab bars in shower next week) Additional Comments: CNA comes M-F for 4 hrs each day    Prior Function            PT Goals (current goals can now be found in the care plan section) Acute Rehab PT Goals Patient Stated Goal: Get home PT Goal Formulation: With patient Time For Goal Achievement: 09/02/21 Potential to Achieve Goals: Good Progress towards PT goals: Progressing toward goals    Frequency    Min 4X/week      PT Plan Current plan remains appropriate    Co-evaluation              AM-PAC PT "6 Clicks" Mobility   Outcome Measure  Help needed turning from your back to your side while in a flat bed without using bedrails?: A Little Help needed moving from lying on your back to sitting on the  side of a flat bed without using bedrails?: A Little Help needed moving to and from a bed to a chair (including a wheelchair)?: A Little Help needed standing up from a chair using your arms (e.g., wheelchair or bedside chair)?: A Little Help needed to walk in hospital room?: A Little Help needed climbing 3-5 steps with a railing? : A Little 6 Click Score: 18    End of Session Equipment Utilized During Treatment: Gait belt Activity Tolerance: Patient tolerated treatment well;Patient limited by fatigue Patient left: in bed;with call bell/phone within reach Nurse Communication: Mobility status PT Visit Diagnosis: Unsteadiness on feet (R26.81);Other abnormalities of gait and mobility (R26.89);Muscle weakness (generalized) (M62.81);Difficulty in walking, not elsewhere classified (R26.2);Other symptoms and signs involving the nervous system (R29.898);Hemiplegia and hemiparesis Hemiplegia - Right/Left: Left Hemiplegia - dominant/non-dominant: Non-dominant Hemiplegia - caused by: Cerebral infarction     Time: 1093-2355 PT Time Calculation (min) (ACUTE ONLY): 34 min  Charges:  $Gait Training: 23-37 mins                     Rolinda Roan, PT, DPT Acute Rehabilitation Services Pager: [phone] Office: [phone]    Thelma Comp 08/21/2021, 10:07 AM

## 2021-08-21 NOTE — Discharge Summary (Addendum)
Discharge Summary  Sonya Poole DJT:[id number] DOB: [birth year]  PCP: System, Provider Not In  Admit date: 08/18/2021 Discharge date: 08/21/2021  Time spent: 35 minutes.  Recommendations for Outpatient Follow-up:  Follow-up with neurology in 4 weeks. Follow-up with electrophysiology in 1 to 2 weeks. Follow-up with cardiology in 1 to 2 weeks. Follow-up with your PCP in 1 to 2 weeks. Continue PT OT with assistance and fall precautions Take your medications as prescribed.  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    Discharge Condition: Stable  Diet recommendation: Heart healthy carb modified diet.  Vitals:   08/21/21 1440 08/21/21 1716  BP: (!) 132/91 (!) 160/88  Pulse: (!) 105 (!) 107  Resp: 20 20  Temp:  97.8 F (36.6 C)  SpO2: 94% 100%    History of present illness:  71 year old African-American female with a prior history of multiple myeloma currently being treated by [facility], history of type II diabetes, hypertension, morbid obesity, peripheral neuropathy, history of stroke with residual left-sided weakness presents to [facility] ER with sudden onset of slurred speech, lasting about 20 minutes.  Her husband brought her to the ER.  By the time she presented to the ER, her slurred speech had resolved.  Admitted for stroke work-up.     Work-up revealed CT head showing an old right parietal and occipital stroke.  This was seen on previous MRI done at [facility] in March 2022.   It also showed multiple round lytic lesions in her skull consistent with her history of multiple myeloma.  Seen by neurology/stroke team.  MRI brain revealed acute right MCA CVA with scattered infarcts.  MRA head and neck unremarkable.  Bilateral lower extremity Doppler ultrasound negative for DVT.  She was started on dual antiplatelets and statin.  Neurology/stroke team recommended TEE and loop recorder for embolic work-up.  Plan TEE and implantable loop recorder placement on 08/21/2021.     08/21/2021: Patient was seen at her bedside.  There were no acute events overnight.  She has no new complaints.   TEE 08/21/2021 by Dr. Marlou Porch, no left atrial appendage thrombus, possible small PFO.  Okay to discharge from neurology standpoint.  Implantable loop recorder placed on 08/21/2021.  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 MRI  Right MCA scattered infarcts MRA head and neck unremarkable LE venous doppler Negative for DVT. 2D Echo LVEF 60 to 07%, grade 1 diastolic dysfunction. LDL 47, at goal, goal less than 70 HgbA1c, 9.9, goal less than 7.0. UDS negative. Heparin subq for VTE prophylaxis aspirin 325 mg daily prior to admission, now on aspirin  81 mg daily and clopidogrel 75 mg daily DAPT for 3 weeks and then plavix alone. Patient counseled to be compliant with her antithrombotic medications Ongoing aggressive stroke risk factor management Therapy recommendations: Home health PT OT. Disposition: Likely to home with home health services  once neurology/stroke team signs off. TEE with possible small PFO, no evidence of thrombus. Implantable loop recorder placed on 08/21/2021. Follow-up with neurology in 4 weeks.   Hx of stroke 08/2020 with left sided weakness and numbness. MRI showed right parietal postcentral gyrus infarct. CTA head and neck unremarkable. EF 70%. LDL 71 and A1C 9.4. she was discharged on ASA 325 and lipitor 40. Post stroke it was thought that her stroke was caused by MM meds of Pomalidomide, it was discontinued by her oncologist, and changed to cyclophosphamide.  She followed with cardiology for Ziopatch and found to have 40 brief episode of SVT. Since asymptomatic, no treatment offered. Walk with walker at home Continue PT OT with assistance and fall precautions.   Multiple myeloma Followed with [facility] oncology  Now on Daratumumab, cyclophosphamide and Dexamethasone Per pt, her MM is stable Follow-up with your oncologist   Type 2 diabetes with hyperglycemia. Hemoglobin A1c is 9.9, goal less than 7.0. Resume home regimen Follow-up with your primary care provider.   Ambulatory dysfunction poststroke PT OT assessed and recommended home health PT OT Continue fall precautions   CKD 3B Appears to be at her baseline creatinine 1.2 with GFR 46 Continue to avoid nephrotoxic agents, dehydration and hypotension. Follow-up with your PCP.   Chronic peripheral neuropathy Continue home gabapentin   Hypertension Resume home regimen Follow-up with your PCP.     Code Status: Full code    Antimicrobials: None.    Procedures: TEE Implantable loop recorder placement.  Consultations: Neurology Cardiology EP  Discharge Exam: BP (!) 160/88 (BP Location: Left Arm)    Pulse (!) 107    Temp 97.8 F (36.6 C) (Oral)    Resp 20    Ht _0  (1.727 m)    Wt 121.4 kg    SpO2 100%    BMI 40.69 kg/m  General: 71 y.o. year-old female well developed well nourished in no acute distress.  Alert and  interactive. Cardiovascular: Regular rate and rhythm with no rubs or gallops.  No thyromegaly or JVD noted.   Respiratory: Clear to auscultation with no wheezes or rales. Good inspiratory effort. Abdomen: Soft nontender nondistended with normal bowel sounds x4 quadrants. Musculoskeletal: No lower extremity edema. 2/4 pulses in all 4 extremities. Psychiatry: Mood is appropriate for condition and setting  Discharge Instructions You were cared for by a hospitalist during your hospital stay. If you have any questions about your discharge medications or the care you received while you were in the hospital after you are discharged, you can call the unit and asked to speak with the hospitalist on call if the hospitalist that took care of you is not available. Once you are discharged, your primary care physician will handle any further medical issues. Please note that NO REFILLS for any discharge medications will be authorized once you are discharged, as it is imperative that you return to your primary care physician (or establish a relationship with a primary care physician if you do not have one) for your aftercare needs so that they can reassess your need for medications and monitor your lab values.  Discharge Instructions     Ambulatory referral to Neurology   Complete by: As directed  Follow up with stroke clinic NP (Jessica E. Lopez or Cecille Rubin, if both not available, consider Zachery Dauer, or A[place]) at [facility] in about 4 weeks. Thanks.      Allergies as of 08/21/2021       Reactions   Lisinopril Swelling        Medication List     TAKE these medications    acyclovir 400 MG tablet Commonly known as: ZOVIRAX Take 400 mg by mouth 2 (two) times daily.   amLODipine 10 MG tablet Commonly known as: NORVASC Take 10 mg by mouth daily.   aspirin 81 MG EC tablet Take 1 tablet (81 mg total) by mouth daily for 18 days. Swallow whole. Start taking on: August 22, 2021 What changed:   medication strength how much to take additional instructions   clopidogrel 75 MG tablet Commonly known as: PLAVIX Take 1 tablet (75 mg total) by mouth daily.   folic acid 1 MG tablet Commonly known as: FOLVITE Take 1 tablet by mouth daily.   gabapentin 300 MG capsule Commonly known as: NEURONTIN Take 900 mg by mouth in the morning and at bedtime.   Insulin Glargine-Lixisenatide 100-33 UNT-MCG/ML Sopn Inject 38 Units into the skin daily as needed. If blood is 250 or higher   lidocaine-prilocaine cream Commonly known as: EMLA Apply 1 application topically as needed (port site access every other wednesday).   metFORMIN 500 MG 24 hr tablet Commonly known as: GLUCOPHAGE-XR Take 500 mg by mouth 2 (two) times daily.   nortriptyline 10 MG capsule Commonly known as: PAMELOR Take 10 mg by mouth at bedtime.   omeprazole 20 MG capsule Commonly known as: PRILOSEC Take 20 mg by mouth daily.   oxyCODONE 5 MG immediate release tablet Commonly known as: Oxy IR/ROXICODONE Take 5 mg by mouth daily as needed for moderate pain.   potassium chloride 10 MEQ CR capsule Commonly known as: MICRO-K Take 10 mEq by mouth 2 (two) times daily.   pravastatin 20 MG tablet Commonly known as: PRAVACHOL Take 1 tablet (20 mg total) by mouth daily.               Durable Medical Equipment  (From admission, onward)           Start     Ordered   08/20/21 1347  For home use only DME 3 n 1  Once        08/20/21 1346           Allergies  Allergen Reactions   Lisinopril Swelling    Follow-up Information     [facility] Follow up.   Specialty: Home Health Services Why: also known as [facility] Contact information: [place] [address] [phone]         [facility]. Schedule an appointment as soon as possible for a visit in 1 month(s).   Specialty: Neurology Why: stroke clinic Contact information: [address] [place] [phone]                 The results of significant diagnostics from this hospitalization (including imaging, microbiology, ancillary and laboratory) are listed below for reference.    Significant Diagnostic Studies: CT HEAD WO CONTRAST  Result Date: 08/19/2021 CLINICAL DATA:  Sudden onset of expressive aphasia. EXAM: CT HEAD WITHOUT CONTRAST TECHNIQUE: Contiguous axial images were obtained from the base of the skull through the vertex without intravenous contrast. RADIATION DOSE REDUCTION: This exam  was performed according to the departmental dose-optimization program which includes automated exposure control, adjustment of the mA and/or kV according to patient size and/or use of iterative reconstruction technique. COMPARISON:  None. FINDINGS: Brain: There is mild cerebral atrophy with widening of the extra-axial spaces and ventricular dilatation. There are areas of decreased attenuation within the white matter tracts of the supratentorial brain, consistent with microvascular disease changes. Small chronic right anterior parietal and right occipital lobe infarcts are seen. Vascular: No hyperdense vessel or unexpected calcification. Skull: Negative for acute fracture. Innumerable benign appearing subcentimeter round lytic areas are seen throughout the skull. Sinuses/Orbits: No acute finding. Other: None. IMPRESSION: 1. No acute intracranial abnormality. 2. Small chronic right anterior parietal and right occipital lobe infarcts. 3. Generalized cerebral atrophy Electronically Signed   By: Virgina Norfolk M.D.   On: 08/19/2021 01:09   MR ANGIO HEAD WO CONTRAST  Result Date: 08/19/2021 CLINICAL DATA:  71 year old female TIA. EXAM: MRA HEAD WITHOUT CONTRAST TECHNIQUE: Angiographic images of the Circle of Willis were acquired using MRA technique without intravenous contrast. COMPARISON:  Noncontrast head CT 0054 hours today. FINDINGS: Anterior  circulation: Antegrade flow in both ICA siphons. Mild siphon irregularity. No siphon stenosis. Normal bilateral posterior communicating artery origins. Patent carotid termini, MCA and ACA origins. Diminutive or absent anterior communicating artery. Visible bilateral ACA branches are within normal limits. Bilateral MCA M1 segments and MCA bifurcations are patent. Visible bilateral MCA branches are within normal limits. Posterior circulation: Antegrade flow in the posterior circulation with codominant distal vertebral arteries. Patent left PICA and dominant appearing right AICA origins. No distal vertebral or vertebrobasilar junction stenosis. Patent basilar artery without stenosis. Patent SCA and PCA origins. Tortuous right PCA P1 segment. Fetal type left PCA origin. Right posterior communicating artery present but smaller. Bilateral PCA branches are within normal limits. Anatomic variants: Fetal type left PCA origin. Other: Abnormal susceptibility in the posterior right greater than left cerebellum, follow-up MRI brain pending at this time. No intracranial mass effect or ventriculomegaly. IMPRESSION: 1. Negative intracranial MRA. 2. Brain MRI pending, reported separately. Electronically Signed   By: Genevie Ann M.D.   On: 08/19/2021 08:23   MR ANGIO NECK WO CONTRAST  Result Date: 08/19/2021 CLINICAL DATA:  71 year old female with TIA. Patchy acute right MCA and acute to subacute right PCA territory infarcts on brain MRI today. EXAM: MRA NECK WITHOUT CONTRAST TECHNIQUE: Angiographic images of the neck were acquired using MRA technique without intravenous contrast. Carotid stenosis measurements (when applicable) are obtained utilizing NASCET criteria, using the distal internal carotid diameter as the denominator. COMPARISON:  Brain MRI and intracranial MRA today reported separately. FINDINGS: 3D time-of-flight imaging. Mild motion degradation. Limited detail of the aortic arch and proximal great vessels. Partially  retropharyngeal course of the right carotid in the neck with preserved antegrade right carotid flow to the skull base. No evidence of hemodynamically significant stenosis. Antegrade left carotid flow in the neck to the skull base. Tortuous left ICA distal to the bulb, but no evidence of hemodynamically significant stenosis. Codominant appearing cervical vertebral arteries with antegrade flow signal to the vertebrobasilar junction. Proximal vertebral artery detail limited by motion, but no evidence of hemodynamically significant stenosis. Visible intracranial arteries appear stable from the earlier circle-of-Willis MRA. IMPRESSION: 1. No evidence of hemodynamically significant cervical carotid or vertebral artery stenosis. 2. Tortuous carotid arteries, retropharyngeal on the right. Electronically Signed   By: Genevie Ann M.D.   On: 08/19/2021 09:05   MR BRAIN WO CONTRAST  Result Date: 08/19/2021 CLINICAL DATA:  71 year old female TIA. EXAM: MRI HEAD WITHOUT CONTRAST TECHNIQUE: Multiplanar, multiecho pulse sequences of the brain and surrounding structures were obtained without intravenous contrast. COMPARISON:  Head CT 0054 hours today. Intracranial MRA today. FINDINGS: Brain: Patchy, scattered cortical restricted diffusion in the right frontal operculum and tracking in the mid right middle and superior frontal gyrus, pre motor area (series 2 images 32 through 38). Superimposed chronic right perirolandic cortical encephalomalacia and confluent hemosiderin (series 7, image 75). Occasional right MCA territory punctate white matter restricted diffusion. Superimposed patchy right occipital pole cortical restricted diffusion (series 2, image 25). Cytotoxic edema in the affected areas with no acute hemorrhage or mass effect. Extensive superimposed bilateral cerebral white matter T2 and FLAIR hyperintensity including areas most resembling cystic encephalomalacia from previous white matter infarcts. Similar T2 heterogeneity  in the right thalamus. Patchy chronic encephalomalacia in the posterior cerebellum greater on the right. Chronic microhemorrhage in the left cerebellum. Chronic microhemorrhage in the right external capsule. No midline shift, mass effect, evidence of mass lesion, ventriculomegaly, extra-axial collection or acute intracranial hemorrhage. Cervicomedullary junction and pituitary are within normal limits. Vascular: Major intracranial vascular flow voids are preserved. Skull and upper cervical spine: Negative visible cervical spine. Visualized bone marrow signal is within normal limits. Sinuses/Orbits: Negative. Other: Grossly normal visible internal auditory structures. Negative visible scalp and face. IMPRESSION: 1. Patchy acute infarcts in the Right MCA middle division, including pre motor involvement. And smaller acute to subacute patchy infarct in the Right PCA territory, right occipital pole. No acute hemorrhage or mass effect. 2. 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 This includes chronic right perirolandic encephalomalacia with abundant hemosiderin, and chronic posterior cerebellar infarcts, corresponding to susceptibility seen on earlier MRA. Electronically Signed   By: Genevie Ann M.D.   On: 08/19/2021 08:39   EP PPM/ICD IMPLANT  Result Date: 08/21/2021 CONCLUSIONS:  1. Successful implantation of a [employer] Reveal LINQ implantable loop recorder for a history of cryptogenic stroke  2. No early apparent complications.   DG Chest Portable 1 View  Result Date: 08/19/2021 CLINICAL DATA:  Port placement. EXAM: PORTABLE CHEST 1 VIEW COMPARISON:  None. FINDINGS: The heart size and mediastinal contours are within normal limits. The lungs are clear without effusions or infiltrates. No pneumothorax is seen. No acute osseous abnormality. A right internal jugular chest port terminates over the right atrium. IMPRESSION: Right chest port terminates over the right atrium. Electronically Signed    By: Brett Fairy M.D.   On: 08/19/2021 04:55   ECHOCARDIOGRAM COMPLETE  Result Date: 08/19/2021    ECHOCARDIOGRAM REPORT   Patient Name:   Sonya Poole Date of Exam: 08/19/2021 Medical Rec #:  [id number]     Height:       68.0 in Accession #:    [id number]    Weight:       260.0 lb Date of Birth:  1950/11/11     BSA:          2.285 m Patient Age:    50 years      BP:           118/86 mmHg Patient Gender: F             HR:           100 bpm. Exam Location:  Inpatient Procedure: 2D Echo, Cardiac Doppler, Color Doppler and Intracardiac            Opacification Agent Indications:  TIA  History:        Patient has no prior history of Echocardiogram examinations.                 Risk Factors:Hypertension and Diabetes.  Sonographer:    Clayton Lefort RDCS (AE) Referring Phys: [id number] ERIC CHEN  Sonographer Comments: Technically difficult study due to poor echo windows and patient is morbidly obese. Image acquisition challenging due to patient body habitus. IMPRESSIONS  1. Left ventricular ejection fraction, by estimation, is 60 to 65%. The left ventricle has normal function. The left ventricle has no regional wall motion abnormalities. There is mild left ventricular hypertrophy. Left ventricular diastolic parameters are consistent with Grade I diastolic dysfunction (impaired relaxation).  2. Right ventricular systolic function is normal. The right ventricular size is normal.  3. The mitral valve is normal in structure. No evidence of mitral valve regurgitation.  4. The aortic valve was not well visualized. Aortic valve regurgitation is not visualized. No aortic stenosis is present. FINDINGS  Left Ventricle: Left ventricular ejection fraction, by estimation, is 60 to 65%. The left ventricle has normal function. The left ventricle has no regional wall motion abnormalities. Definity contrast agent was given IV to delineate the left ventricular  endocardial borders. The left ventricular internal cavity size was normal in size.  There is mild left ventricular hypertrophy. Left ventricular diastolic parameters are consistent with Grade I diastolic dysfunction (impaired relaxation). Right Ventricle: The right ventricular size is normal. Right vetricular wall thickness was not well visualized. Right ventricular systolic function is normal. Left Atrium: Left atrial size was normal in size. Right Atrium: Right atrial size was not well visualized. Pericardium: There is no evidence of pericardial effusion. Mitral Valve: The mitral valve is normal in structure. No evidence of mitral valve regurgitation. MV peak gradient, 6.5 mmHg. The mean mitral valve gradient is 3.0 mmHg. Tricuspid Valve: The tricuspid valve is normal in structure. Tricuspid valve regurgitation is trivial. Aortic Valve: The aortic valve was not well visualized. Aortic valve regurgitation is not visualized. No aortic stenosis is present. Aortic valve mean gradient measures 3.0 mmHg. Aortic valve peak gradient measures 5.0 mmHg. Aortic valve area, by VTI measures 1.80 cm. Pulmonic Valve: The pulmonic valve was not well visualized. Pulmonic valve regurgitation is not visualized. Aorta: The aortic root is normal in size and structure. IAS/Shunts: The interatrial septum was not well visualized.  LEFT VENTRICLE PLAX 2D LVIDd:         4.20 cm   Diastology LVIDs:         3.00 cm   LV e' medial:    4.50 cm/s LV PW:         1.10 cm   LV E/e' medial:  17.0 LV IVS:        1.30 cm   LV e' lateral:   5.25 cm/s LVOT diam:     1.90 cm   LV E/e' lateral: 14.6 LV SV:         34 LV SV Index:   15 LVOT Area:     2.84 cm  LEFT ATRIUM             Index LA diam:        4.30 cm 1.88 cm/m LA Vol (A2C):   44.4 ml 19.43 ml/m LA Vol (A4C):   31.6 ml 13.83 ml/m LA Biplane Vol: 36.8 ml 16.11 ml/m  AORTIC VALVE AV Area (Vmax):    1.87 cm AV Area (Vmean):   1.86 cm  AV Area (VTI):     1.80 cm AV Vmax:           112.00 cm/s AV Vmean:          73.800 cm/s AV VTI:            0.187 m AV Peak Grad:      5.0  mmHg AV Mean Grad:      3.0 mmHg LVOT Vmax:         73.90 cm/s LVOT Vmean:        48.400 cm/s LVOT VTI:          0.119 m LVOT/AV VTI ratio: 0.64  AORTA Ao Root diam: 3.30 cm Ao Asc diam:  3.30 cm MITRAL VALVE MV Area (PHT): 2.59 cm     SHUNTS MV Area VTI:   1.59 cm     Systemic VTI:  0.12 m MV Peak grad:  6.5 mmHg     Systemic Diam: 1.90 cm MV Mean grad:  3.0 mmHg MV Vmax:       1.27 m/s MV Vmean:      76.2 cm/s MV Decel Time: 293 msec MV E velocity: 76.50 cm/s MV A velocity: 130.00 cm/s MV E/A ratio:  0.59 Oswaldo Milian MD Electronically signed by Oswaldo Milian MD Signature Date/Time: 08/19/2021/1:48:35 PM    Final    ECHO TEE  Result Date: 08/21/2021    TRANSESOPHOGEAL ECHO REPORT   Patient Name:   Sonya Poole Date of Exam: 08/21/2021 Medical Rec #:  [id number]     Height:       68.0 in Accession #:    [id number]    Weight:       267.6 lb Date of Birth:  1950-09-01     BSA:          2.313 m Patient Age:    33 years      BP:           132/71 mmHg Patient Gender: F             HR:           70 bpm. Exam Location:  Inpatient Procedure: Transesophageal Echo, Cardiac Doppler and [place]ine Contrast Bubble            Study Indications:     Stroke  History:         Patient has prior history of Echocardiogram examinations, most                  recent 08/19/2021. Risk Factors:Diabetes and Hypertension.                  Cancer.  Sonographer:     Darlina Sicilian RDCS Referring Phys:  [id number] Thana Farr SKAINS Diagnosing Phys: Candee Furbish MD PROCEDURE: After discussion of the risks and benefits of a TEE, an informed consent was obtained from the patient. The transesophogeal probe was passed without difficulty through the esophogus of the patient. Imaged were obtained with the patient in a left lateral decubitus position. Local oropharyngeal anesthetic was provided with Cetacaine. Sedation performed by different physician. Image quality was good. The patient's vital signs; including heart rate, blood pressure, and  oxygen saturation; remained stable throughout the procedure. The patient developed Respiratory depression during the procedure. IMPRESSIONS  1. 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 Difficult to determine cardiac cycle timing secondary to port placement distal to septum. Cannot exclude intrapulmonary shunt.  2. Left ventricular ejection  fraction, by estimation, is 60 to 65%. The left ventricle has normal function. The left ventricle has no regional wall motion abnormalities.  3. Right atrial Port-A-Cath curls in the right atrium and abuts the tricuspid valve. Right ventricular systolic function is normal. The right ventricular size is normal.  4. No left atrial/left atrial appendage thrombus was detected.  5. The mitral valve is normal in structure. Mild mitral valve regurgitation. No evidence of mitral stenosis.  6. The aortic valve is normal in structure. Aortic valve regurgitation is not visualized. No aortic stenosis is present.  7. The inferior vena cava is normal in size with greater than 50% respiratory variability, suggesting right atrial pressure of 3 mmHg.  8. There is a small patent foramen ovale. FINDINGS  Left Ventricle: Left ventricular ejection fraction, by estimation, is 60 to 65%. The left ventricle has normal function. The left ventricle has no regional wall motion abnormalities. The left ventricular internal cavity size was normal in size. There is  no left ventricular hypertrophy. Right Ventricle: Right atrial Port-A-Cath curls in the right atrium and abuts the tricuspid valve. The right ventricular size is normal. No increase in right ventricular wall thickness. Right ventricular systolic function is normal. Left Atrium: Left atrial size was normal in size. No left atrial/left atrial appendage thrombus was detected. Right Atrium: Right atrial size was normal in size. Pericardium: There is no evidence of pericardial effusion. Mitral Valve: The mitral valve is  normal in structure. Mild mitral valve regurgitation. No evidence of mitral valve stenosis. Tricuspid Valve: The tricuspid valve is normal in structure. Tricuspid valve regurgitation is not demonstrated. No evidence of tricuspid stenosis. Aortic Valve: The aortic valve is normal in structure. Aortic valve regurgitation is not visualized. No aortic stenosis is present. Pulmonic Valve: The pulmonic valve was normal in structure. Pulmonic valve regurgitation is not visualized. No evidence of pulmonic stenosis. Aorta: The aortic root is normal in size and structure. Venous: The inferior vena cava is normal in size with greater than 50% respiratory variability, suggesting right atrial pressure of 3 mmHg. IAS/Shunts: No atrial level shunt detected by color flow Doppler. Agitated saline contrast was given intravenously to evaluate for intracardiac shunting. A small patent foramen ovale is detected. 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 Difficult to determine cardiac cycle timing secondary to port placement distal to septum. Cannot exclude pulmonary AVM. A venous catheter is visualized in the right atrium. Candee Furbish MD Electronically signed by Candee Furbish MD Signature Date/Time: 08/21/2021/2:50:17 PM    Final    VAS [place] LOWER EXTREMITY VENOUS (DVT)  Result Date: 08/19/2021  Lower Venous DVT Study Patient Name:  Sonya Poole  Date of Exam:   08/19/2021 Medical Rec #: [id number]      Accession #:    [id number] Date of Birth: 11-Apr-1951      Patient Gender: F Patient Age:   61 years Exam Location:  [facility] Procedure:      VAS [place] LOWER EXTREMITY VENOUS (DVT) Referring Phys: Cornelius Moras XU --------------------------------------------------------------------------------  Indications: Stroke.  Limitations: Body habitus and poor ultrasound/tissue interface. Comparison Study: No prior study Performing Technologist: Maudry Mayhew MHA, RDMS, RVT, RDCS  Examination Guidelines: A complete  evaluation includes B-mode imaging, spectral Doppler, color Doppler, and power Doppler as needed of all accessible portions of each vessel. Bilateral testing is considered an integral part of a complete examination. Limited examinations for reoccurring indications may be performed as noted. The reflux portion of the exam is performed  with the patient in reverse Trendelenburg.  +---------+---------------+---------+-----------+----------+--------------+  RIGHT     Compressibility Phasicity Spontaneity Properties Thrombus Aging  +---------+---------------+---------+-----------+----------+--------------+  CFV       Full            Yes       Yes                                    +---------+---------------+---------+-----------+----------+--------------+  SFJ       Full                                                             +---------+---------------+---------+-----------+----------+--------------+  FV Prox   Full                                                             +---------+---------------+---------+-----------+----------+--------------+  FV Mid    Full                                                             +---------+---------------+---------+-----------+----------+--------------+  FV Distal Full                                                             +---------+---------------+---------+-----------+----------+--------------+  PFV       Full                                                             +---------+---------------+---------+-----------+----------+--------------+  POP       Full            Yes       Yes                                    +---------+---------------+---------+-----------+----------+--------------+   Right Technical Findings: Not visualized segments include PTV, peroneal veins.  +---------+---------------+---------+-----------+----------+--------------+  LEFT      Compressibility Phasicity Spontaneity Properties Thrombus Aging   +---------+---------------+---------+-----------+----------+--------------+  CFV       Full            Yes       Yes                                    +---------+---------------+---------+-----------+----------+--------------+  SFJ       Full                                                             +---------+---------------+---------+-----------+----------+--------------+  FV Prox   Full                                                             +---------+---------------+---------+-----------+----------+--------------+  FV Mid    Full                                                             +---------+---------------+---------+-----------+----------+--------------+  FV Distal Full                                                             +---------+---------------+---------+-----------+----------+--------------+  PFV       Full                                                             +---------+---------------+---------+-----------+----------+--------------+  POP       Full            Yes       Yes                                    +---------+---------------+---------+-----------+----------+--------------+   Left Technical Findings: Not visualized segments include PTV, peroneal veins.   Summary: RIGHT: - There is no evidence of deep vein thrombosis in the lower extremity. However, portions of this examination were limited- see technologist comments above.  - No cystic structure found in the popliteal fossa.  LEFT: - There is no evidence of deep vein thrombosis in the lower extremity. However, portions of this examination were limited- see technologist comments above.  - No cystic structure found in the popliteal fossa.  *See table(s) above for measurements and observations. Electronically signed by Deitra Mayo MD on 08/19/2021 at 3:20:12 PM.    Final     Microbiology: Recent Results (from the past 240 hour(s))  Resp Panel by RT-PCR (Flu A&B, Covid) Nasopharyngeal Swab     Status: None    Collection Time: 08/19/21 12:15 AM   Specimen: Nasopharyngeal Swab; Nasopharyngeal(NP) swabs in vial transport medium  Result Value Ref Range Status   SARS Coronavirus 2 by RT PCR NEGATIVE NEGATIVE Final    Comment: (NOTE) SARS-CoV-2 target nucleic acids are NOT DETECTED.  The SARS-CoV-2 RNA is generally detectable in upper respiratory specimens during the acute phase of infection. The lowest concentration of SARS-CoV-2 viral copies this assay can detect is 138 copies/mL. A negative result does not preclude SARS-Cov-2 infection and should not be used as the sole basis for treatment or other patient management decisions. A negative result may occur with  improper specimen collection/handling, submission of specimen other than nasopharyngeal swab, presence of viral mutation(s) within the areas targeted by this  assay, and inadequate number of viral copies(<138 copies/mL). A negative result must be combined with clinical observations, patient history, and epidemiological information. The expected result is Negative.  Fact Sheet for Patients:  EntrepreneurPulse.com.au  Fact Sheet for Healthcare Providers:  IncredibleEmployment.be  This test is no t yet approved or cleared by the [place] FDA and  has been authorized for detection and/or diagnosis of SARS-CoV-2 by FDA under an Emergency Use Authorization (EUA). This EUA will remain  in effect (meaning this test can be used) for the duration of the COVID-19 declaration under Section 564(b)(1) of the Act, 21 U.S.C.section 360bbb-3(b)(1), unless the authorization is terminated  or revoked sooner.       Influenza A by PCR NEGATIVE NEGATIVE Final   Influenza B by PCR NEGATIVE NEGATIVE Final    Comment: (NOTE) The Xpert Xpress SARS-CoV-2/FLU/RSV plus assay is intended as an aid in the diagnosis of influenza from Nasopharyngeal swab specimens and should not be used as a sole basis for treatment.  Nasal washings and aspirates are unacceptable for Xpert Xpress SARS-CoV-2/FLU/RSV testing.  Fact Sheet for Patients: EntrepreneurPulse.com.au  Fact Sheet for Healthcare Providers: IncredibleEmployment.be  This test is not yet approved or cleared by the [place] FDA and has been authorized for detection and/or diagnosis of SARS-CoV-2 by FDA under an Emergency Use Authorization (EUA). This EUA will remain in effect (meaning this test can be used) for the duration of the COVID-19 declaration under Section 564(b)(1) of the Act, 21 U.S.C. section 360bbb-3(b)(1), unless the authorization is terminated or revoked.  Performed at [facility], [place] [address], [place], [place] [address]      Labs: Basic Metabolic Panel: Recent Labs  Lab 08/19/21 0008  NA 140  K 4.1  CL 104  CO2 25  GLUCOSE 328*  BUN 10  CREATININE 1.27*  CALCIUM 9.0   Liver Function Tests: Recent Labs  Lab 08/19/21 0008  AST 23  ALT 11  ALKPHOS 109  BILITOT 0.9  PROT 6.1*  ALBUMIN 3.2*   No results for input(s): LIPASE, AMYLASE in the last 168 hours. No results for input(s): AMMONIA in the last 168 hours. CBC: Recent Labs  Lab 08/19/21 0008  WBC 6.9  NEUTROABS 5.3  HGB 11.7*  HCT 36.2  MCV 85.2  PLT 162   Cardiac Enzymes: No results for input(s): CKTOTAL, CKMB, CKMBINDEX, TROPONINI in the last 168 hours. BNP: BNP (last 3 results) No results for input(s): BNP in the last 8760 hours.  ProBNP (last 3 results) No results for input(s): PROBNP in the last 8760 hours.  CBG: Recent Labs  Lab 08/20/21 2106 08/21/21 0622 08/21/21 1116 08/21/21 1410 08/21/21 1720  GLUCAP 145* 214* 196* 219* 239*       Signed:  Kayleen Memos, MD [facility] 08/21/2021, 6:01 PM

## 2021-08-21 NOTE — Progress Notes (Signed)
STROKE TEAM PROGRESS NOTE   SUBJECTIVE (INTERVAL HISTORY) No family is at the bedside. Pt sitting in bed for dinner. 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 Loop recorder placed. Pt left facial droop and slurry speech improved. PT/OT recommend HH.   OBJECTIVE Temp:  [97.5 F (36.4 C)-99.1 F (37.3 C)] 98.9 F (37.2 C) (01/23 1410) Pulse Rate:  [89-110] 105 (01/23 1440) Cardiac Rhythm: Sinus tachycardia (01/23 1440) Resp:  [15-20] 20 (01/23 1440) BP: (119-157)/(50-106) 132/91 (01/23 1440) SpO2:  [94 %-100 %] 94 % (01/23 1440) Weight:  [121.4 kg] 121.4 kg (01/23 1306)  Recent Labs  Lab 08/20/21 1617 08/20/21 2106 08/21/21 0622 08/21/21 1116 08/21/21 1410  GLUCAP 240* 145* 214* 196* 219*   Recent Labs  Lab 08/19/21 0008  NA 140  K 4.1  CL 104  CO2 25  GLUCOSE 328*  BUN 10  CREATININE 1.27*  CALCIUM 9.0   Recent Labs  Lab 08/19/21 0008  AST 23  ALT 11  ALKPHOS 109  BILITOT 0.9  PROT 6.1*  ALBUMIN 3.2*   Recent Labs  Lab 08/19/21 0008  WBC 6.9  NEUTROABS 5.3  HGB 11.7*  HCT 36.2  MCV 85.2  PLT 162   No results for input(s): CKTOTAL, CKMB, CKMBINDEX, TROPONINI in the last 168 hours. Recent Labs    08/19/21 0008  LABPROT 13.4  INR 1.0   Recent Labs    08/19/21 0555  COLORURINE AMBER*  LABSPEC 1.021  PHURINE 5.0  GLUCOSEU >=500*  HGBUR NEGATIVE  BILIRUBINUR NEGATIVE  KETONESUR 5*  PROTEINUR 100*  NITRITE NEGATIVE  LEUKOCYTESUR LARGE*       Component Value Date/Time   CHOL 118 08/19/2021 0550   TRIG 118 08/19/2021 0550   HDL 47 08/19/2021 0550   CHOLHDL 2.5 08/19/2021 0550   VLDL 24 08/19/2021 0550   LDLCALC 47 08/19/2021 0550   Lab Results  Component Value Date   HGBA1C 9.9 (H) 08/19/2021      Component Value Date/Time   LABOPIA NONE DETECTED 08/19/2021 0555   COCAINSCRNUR NONE DETECTED 08/19/2021 0555   LABBENZ NONE DETECTED 08/19/2021 0555   AMPHETMU NONE DETECTED 08/19/2021 0555   THCU NONE  DETECTED 08/19/2021 0555   LABBARB NONE DETECTED 08/19/2021 0555    Recent Labs  Lab 08/19/21 0015  ETH <10    I have personally reviewed the radiological images below and agree with the radiology interpretations.  CT HEAD WO CONTRAST  Result Date: 08/19/2021 CLINICAL DATA:  Sudden onset of expressive aphasia. EXAM: CT HEAD WITHOUT CONTRAST TECHNIQUE: Contiguous axial images were obtained from the base of the skull through the vertex without intravenous contrast. RADIATION DOSE REDUCTION: This exam was performed according to the departmental dose-optimization program which includes automated exposure control, adjustment of the mA and/or kV according to patient size and/or use of iterative reconstruction technique. COMPARISON:  None. FINDINGS: Brain: There is mild cerebral atrophy with widening of the extra-axial spaces and ventricular dilatation. There are areas of decreased attenuation within the white matter tracts of the supratentorial brain, consistent with microvascular disease changes. Small chronic right anterior parietal and right occipital lobe infarcts are seen. Vascular: No hyperdense vessel or unexpected calcification. Skull: Negative for acute fracture. Innumerable benign appearing subcentimeter round lytic areas are seen throughout the skull. Sinuses/Orbits: No acute finding. Other: None. IMPRESSION: 1. No acute intracranial abnormality. 2. Small chronic right anterior parietal and right occipital lobe infarcts. 3. Generalized cerebral atrophy Electronically Signed   By: Hoover Browns  Houston M.D.   On: 08/19/2021 01:09   MR ANGIO HEAD WO CONTRAST  Result Date: 08/19/2021 CLINICAL DATA:  71 year old female TIA. EXAM: MRA HEAD WITHOUT CONTRAST TECHNIQUE: Angiographic images of the Circle of Willis were acquired using MRA technique without intravenous contrast. COMPARISON:  Noncontrast head CT 0054 hours today. FINDINGS: Anterior circulation: Antegrade flow in both ICA siphons. Mild siphon  irregularity. No siphon stenosis. Normal bilateral posterior communicating artery origins. Patent carotid termini, MCA and ACA origins. Diminutive or absent anterior communicating artery. Visible bilateral ACA branches are within normal limits. Bilateral MCA M1 segments and MCA bifurcations are patent. Visible bilateral MCA branches are within normal limits. Posterior circulation: Antegrade flow in the posterior circulation with codominant distal vertebral arteries. Patent left PICA and dominant appearing right AICA origins. No distal vertebral or vertebrobasilar junction stenosis. Patent basilar artery without stenosis. Patent SCA and PCA origins. Tortuous right PCA P1 segment. Fetal type left PCA origin. Right posterior communicating artery present but smaller. Bilateral PCA branches are within normal limits. Anatomic variants: Fetal type left PCA origin. Other: Abnormal susceptibility in the posterior right greater than left cerebellum, follow-up MRI brain pending at this time. No intracranial mass effect or ventriculomegaly. IMPRESSION: 1. Negative intracranial MRA. 2. Brain MRI pending, reported separately. Electronically Signed   By: Genevie Ann M.D.   On: 08/19/2021 08:23   MR ANGIO NECK WO CONTRAST  Result Date: 08/19/2021 CLINICAL DATA:  71 year old female with TIA. Patchy acute right MCA and acute to subacute right PCA territory infarcts on brain MRI today. EXAM: MRA NECK WITHOUT CONTRAST TECHNIQUE: Angiographic images of the neck were acquired using MRA technique without intravenous contrast. Carotid stenosis measurements (when applicable) are obtained utilizing NASCET criteria, using the distal internal carotid diameter as the denominator. COMPARISON:  Brain MRI and intracranial MRA today reported separately. FINDINGS: 3D time-of-flight imaging. Mild motion degradation. Limited detail of the aortic arch and proximal great vessels. Partially retropharyngeal course of the right carotid in the neck with  preserved antegrade right carotid flow to the skull base. No evidence of hemodynamically significant stenosis. Antegrade left carotid flow in the neck to the skull base. Tortuous left ICA distal to the bulb, but no evidence of hemodynamically significant stenosis. Codominant appearing cervical vertebral arteries with antegrade flow signal to the vertebrobasilar junction. Proximal vertebral artery detail limited by motion, but no evidence of hemodynamically significant stenosis. Visible intracranial arteries appear stable from the earlier circle-of-Willis MRA. IMPRESSION: 1. No evidence of hemodynamically significant cervical carotid or vertebral artery stenosis. 2. Tortuous carotid arteries, retropharyngeal on the right. Electronically Signed   By: Genevie Ann M.D.   On: 08/19/2021 09:05   MR BRAIN WO CONTRAST  Result Date: 08/19/2021 CLINICAL DATA:  71 year old female TIA. EXAM: MRI HEAD WITHOUT CONTRAST TECHNIQUE: Multiplanar, multiecho pulse sequences of the brain and surrounding structures were obtained without intravenous contrast. COMPARISON:  Head CT 0054 hours today. Intracranial MRA today. FINDINGS: Brain: Patchy, scattered cortical restricted diffusion in the right frontal operculum and tracking in the mid right middle and superior frontal gyrus, pre motor area (series 2 images 32 through 38). Superimposed chronic right perirolandic cortical encephalomalacia and confluent hemosiderin (series 7, image 75). Occasional right MCA territory punctate white matter restricted diffusion. Superimposed patchy right occipital pole cortical restricted diffusion (series 2, image 25). Cytotoxic edema in the affected areas with no acute hemorrhage or mass effect. Extensive superimposed bilateral cerebral white matter T2 and FLAIR hyperintensity including areas most resembling cystic encephalomalacia from previous white  matter infarcts. Similar T2 heterogeneity in the right thalamus. Patchy chronic encephalomalacia in the  posterior cerebellum greater on the right. Chronic microhemorrhage in the left cerebellum. Chronic microhemorrhage in the right external capsule. No midline shift, mass effect, evidence of mass lesion, ventriculomegaly, extra-axial collection or acute intracranial hemorrhage. Cervicomedullary junction and pituitary are within normal limits. Vascular: Major intracranial vascular flow voids are preserved. Skull and upper cervical spine: Negative visible cervical spine. Visualized bone marrow signal is within normal limits. Sinuses/Orbits: Negative. Other: Grossly normal visible internal auditory structures. Negative visible scalp and face. IMPRESSION: 1. Patchy acute infarcts in the Right MCA middle division, including pre motor involvement. And smaller acute to subacute patchy infarct in the Right PCA territory, right occipital pole. No acute hemorrhage or mass effect. 2. 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 This includes chronic right perirolandic encephalomalacia with abundant hemosiderin, and chronic posterior cerebellar infarcts, corresponding to susceptibility seen on earlier MRA. Electronically Signed   By: Genevie Ann M.D.   On: 08/19/2021 08:39   EP PPM/ICD IMPLANT  Result Date: 08/21/2021 CONCLUSIONS:  1. Successful implantation of a [employer] Reveal LINQ implantable loop recorder for a history of cryptogenic stroke  2. No early apparent complications.   DG Chest Portable 1 View  Result Date: 08/19/2021 CLINICAL DATA:  Port placement. EXAM: PORTABLE CHEST 1 VIEW COMPARISON:  None. FINDINGS: The heart size and mediastinal contours are within normal limits. The lungs are clear without effusions or infiltrates. No pneumothorax is seen. No acute osseous abnormality. A right internal jugular chest port terminates over the right atrium. IMPRESSION: Right chest port terminates over the right atrium. Electronically Signed   By: Brett Fairy M.D.   On: 08/19/2021 04:55    ECHOCARDIOGRAM COMPLETE  Result Date: 08/19/2021    ECHOCARDIOGRAM REPORT   Patient Name:   Sonya Poole Date of Exam: 08/19/2021 Medical Rec #:  [id number]     Height:       68.0 in Accession #:    [id number]    Weight:       260.0 lb Date of Birth:  1950-08-14     BSA:          2.285 m Patient Age:    79 years      BP:           118/86 mmHg Patient Gender: F             HR:           100 bpm. Exam Location:  Inpatient Procedure: 2D Echo, Cardiac Doppler, Color Doppler and Intracardiac            Opacification Agent Indications:    TIA  History:        Patient has no prior history of Echocardiogram examinations.                 Risk Factors:Hypertension and Diabetes.  Sonographer:    Clayton Lefort RDCS (AE) Referring Phys: [id number] ERIC CHEN  Sonographer Comments: Technically difficult study due to poor echo windows and patient is morbidly obese. Image acquisition challenging due to patient body habitus. IMPRESSIONS  1. Left ventricular ejection fraction, by estimation, is 60 to 65%. The left ventricle has normal function. The left ventricle has no regional wall motion abnormalities. There is mild left ventricular hypertrophy. Left ventricular diastolic parameters are consistent with Grade I diastolic dysfunction (impaired relaxation).  2. Right ventricular systolic function is normal. The right ventricular size is normal.  3. The mitral valve is normal in structure. No evidence of mitral valve regurgitation.  4. The aortic valve was not well visualized. Aortic valve regurgitation is not visualized. No aortic stenosis is present. FINDINGS  Left Ventricle: Left ventricular ejection fraction, by estimation, is 60 to 65%. The left ventricle has normal function. The left ventricle has no regional wall motion abnormalities. Definity contrast agent was given IV to delineate the left ventricular  endocardial borders. The left ventricular internal cavity size was normal in size. There is mild left ventricular hypertrophy.  Left ventricular diastolic parameters are consistent with Grade I diastolic dysfunction (impaired relaxation). Right Ventricle: The right ventricular size is normal. Right vetricular wall thickness was not well visualized. Right ventricular systolic function is normal. Left Atrium: Left atrial size was normal in size. Right Atrium: Right atrial size was not well visualized. Pericardium: There is no evidence of pericardial effusion. Mitral Valve: The mitral valve is normal in structure. No evidence of mitral valve regurgitation. MV peak gradient, 6.5 mmHg. The mean mitral valve gradient is 3.0 mmHg. Tricuspid Valve: The tricuspid valve is normal in structure. Tricuspid valve regurgitation is trivial. Aortic Valve: The aortic valve was not well visualized. Aortic valve regurgitation is not visualized. No aortic stenosis is present. Aortic valve mean gradient measures 3.0 mmHg. Aortic valve peak gradient measures 5.0 mmHg. Aortic valve area, by VTI measures 1.80 cm. Pulmonic Valve: The pulmonic valve was not well visualized. Pulmonic valve regurgitation is not visualized. Aorta: The aortic root is normal in size and structure. IAS/Shunts: The interatrial septum was not well visualized.  LEFT VENTRICLE PLAX 2D LVIDd:         4.20 cm   Diastology LVIDs:         3.00 cm   LV e' medial:    4.50 cm/s LV PW:         1.10 cm   LV E/e' medial:  17.0 LV IVS:        1.30 cm   LV e' lateral:   5.25 cm/s LVOT diam:     1.90 cm   LV E/e' lateral: 14.6 LV SV:         34 LV SV Index:   15 LVOT Area:     2.84 cm  LEFT ATRIUM             Index LA diam:        4.30 cm 1.88 cm/m LA Vol (A2C):   44.4 ml 19.43 ml/m LA Vol (A4C):   31.6 ml 13.83 ml/m LA Biplane Vol: 36.8 ml 16.11 ml/m  AORTIC VALVE AV Area (Vmax):    1.87 cm AV Area (Vmean):   1.86 cm AV Area (VTI):     1.80 cm AV Vmax:           112.00 cm/s AV Vmean:          73.800 cm/s AV VTI:            0.187 m AV Peak Grad:      5.0 mmHg AV Mean Grad:      3.0 mmHg LVOT Vmax:          73.90 cm/s LVOT Vmean:        48.400 cm/s LVOT VTI:          0.119 m LVOT/AV VTI ratio: 0.64  AORTA Ao Root diam: 3.30 cm Ao Asc diam:  3.30 cm MITRAL VALVE MV Area (PHT): 2.59 cm     SHUNTS MV  Area VTI:   1.59 cm     Systemic VTI:  0.12 m MV Peak grad:  6.5 mmHg     Systemic Diam: 1.90 cm MV Mean grad:  3.0 mmHg MV Vmax:       1.27 m/s MV Vmean:      76.2 cm/s MV Decel Time: 293 msec MV E velocity: 76.50 cm/s MV A velocity: 130.00 cm/s MV E/A ratio:  0.59 Oswaldo Milian MD Electronically signed by Oswaldo Milian MD Signature Date/Time: 08/19/2021/1:48:35 PM    Final    ECHO TEE  Result Date: 08/21/2021    TRANSESOPHOGEAL ECHO REPORT   Patient Name:   Sonya Poole Date of Exam: 08/21/2021 Medical Rec #:  [id number]     Height:       68.0 in Accession #:    [id number]    Weight:       267.6 lb Date of Birth:  [birth year]     BSA:          2.313 m Patient Age:    71 years      BP:           132/71 mmHg Patient Gender: F             HR:           70 bpm. Exam Location:  Inpatient Procedure: Transesophageal Echo, Cardiac Doppler and Saline Contrast Bubble            Study Indications:     Stroke  History:         Patient has prior history of Echocardiogram examinations, most                  recent 08/19/2021. Risk Factors:Diabetes and Hypertension.                  Cancer.  Sonographer:     Darlina Sicilian RDCS Referring Phys:  [id number] Thana Farr SKAINS Diagnosing Phys: Candee Furbish MD PROCEDURE: After discussion of the risks and benefits of a TEE, an informed consent was obtained from the patient. The transesophogeal probe was passed without difficulty through the esophogus of the patient. Imaged were obtained with the patient in a left lateral decubitus position. Local oropharyngeal anesthetic was provided with Cetacaine. Sedation performed by different physician. Image quality was good. The patient's vital signs; including heart rate, blood pressure, and oxygen saturation; remained stable throughout the  procedure. The patient developed Respiratory depression during the procedure. IMPRESSIONS  1. 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 Difficult to determine cardiac cycle timing secondary to port placement distal to septum. Cannot exclude intrapulmonary shunt.  2. Left ventricular ejection fraction, by estimation, is 60 to 65%. The left ventricle has normal function. The left ventricle has no regional wall motion abnormalities.  3. Right atrial Port-A-Cath curls in the right atrium and abuts the tricuspid valve. Right ventricular systolic function is normal. The right ventricular size is normal.  4. No left atrial/left atrial appendage thrombus was detected.  5. The mitral valve is normal in structure. Mild mitral valve regurgitation. No evidence of mitral stenosis.  6. The aortic valve is normal in structure. Aortic valve regurgitation is not visualized. No aortic stenosis is present.  7. The inferior vena cava is normal in size with greater than 50% respiratory variability, suggesting right atrial pressure of 3 mmHg.  8. There is a small patent foramen ovale. FINDINGS  Left Ventricle: Left ventricular ejection fraction, by  estimation, is 60 to 65%. The left ventricle has normal function. The left ventricle has no regional wall motion abnormalities. The left ventricular internal cavity size was normal in size. There is  no left ventricular hypertrophy. Right Ventricle: Right atrial Port-A-Cath curls in the right atrium and abuts the tricuspid valve. The right ventricular size is normal. No increase in right ventricular wall thickness. Right ventricular systolic function is normal. Left Atrium: Left atrial size was normal in size. No left atrial/left atrial appendage thrombus was detected. Right Atrium: Right atrial size was normal in size. Pericardium: There is no evidence of pericardial effusion. Mitral Valve: The mitral valve is normal in structure. Mild mitral valve regurgitation.  No evidence of mitral valve stenosis. Tricuspid Valve: The tricuspid valve is normal in structure. Tricuspid valve regurgitation is not demonstrated. No evidence of tricuspid stenosis. Aortic Valve: The aortic valve is normal in structure. Aortic valve regurgitation is not visualized. No aortic stenosis is present. Pulmonic Valve: The pulmonic valve was normal in structure. Pulmonic valve regurgitation is not visualized. No evidence of pulmonic stenosis. Aorta: The aortic root is normal in size and structure. Venous: The inferior vena cava is normal in size with greater than 50% respiratory variability, suggesting right atrial pressure of 3 mmHg. IAS/Shunts: No atrial level shunt detected by color flow Doppler. Agitated saline contrast was given intravenously to evaluate for intracardiac shunting. A small patent foramen ovale is detected. 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 Difficult to determine cardiac cycle timing secondary to port placement distal to septum. Cannot exclude pulmonary AVM. A venous catheter is visualized in the right atrium. Candee Furbish MD Electronically signed by Candee Furbish MD Signature Date/Time: 08/21/2021/2:50:17 PM    Final    VAS [place] LOWER EXTREMITY VENOUS (DVT)  Result Date: 08/19/2021  Lower Venous DVT Study Patient Name:  Sonya Poole  Date of Exam:   08/19/2021 Medical Rec #: [id number]      Accession #:    [id number] Date of Birth: 11/30/1950      Patient Gender: F Patient Age:   71 years Exam Location:  [facility] Procedure:      VAS [place] LOWER EXTREMITY VENOUS (DVT) Referring Phys: Cornelius Moras Jaquese Irving --------------------------------------------------------------------------------  Indications: Stroke.  Limitations: Body habitus and poor ultrasound/tissue interface. Comparison Study: No prior study Performing Technologist: Maudry Mayhew MHA, RDMS, RVT, RDCS  Examination Guidelines: A complete evaluation includes B-mode imaging, spectral Doppler,  color Doppler, and power Doppler as needed of all accessible portions of each vessel. Bilateral testing is considered an integral part of a complete examination. Limited examinations for reoccurring indications may be performed as noted. The reflux portion of the exam is performed with the patient in reverse Trendelenburg.  +---------+---------------+---------+-----------+----------+--------------+  RIGHT     Compressibility Phasicity Spontaneity Properties Thrombus Aging  +---------+---------------+---------+-----------+----------+--------------+  CFV       Full            Yes       Yes                                    +---------+---------------+---------+-----------+----------+--------------+  SFJ       Full                                                             +---------+---------------+---------+-----------+----------+--------------+  FV Prox   Full                                                             +---------+---------------+---------+-----------+----------+--------------+  FV Mid    Full                                                             +---------+---------------+---------+-----------+----------+--------------+  FV Distal Full                                                             +---------+---------------+---------+-----------+----------+--------------+  PFV       Full                                                             +---------+---------------+---------+-----------+----------+--------------+  POP       Full            Yes       Yes                                    +---------+---------------+---------+-----------+----------+--------------+   Right Technical Findings: Not visualized segments include PTV, peroneal veins.  +---------+---------------+---------+-----------+----------+--------------+  LEFT      Compressibility Phasicity Spontaneity Properties Thrombus Aging  +---------+---------------+---------+-----------+----------+--------------+  CFV       Full             Yes       Yes                                    +---------+---------------+---------+-----------+----------+--------------+  SFJ       Full                                                             +---------+---------------+---------+-----------+----------+--------------+  FV Prox   Full                                                             +---------+---------------+---------+-----------+----------+--------------+  FV Mid    Full                                                             +---------+---------------+---------+-----------+----------+--------------+  FV Distal Full                                                             +---------+---------------+---------+-----------+----------+--------------+  PFV       Full                                                             +---------+---------------+---------+-----------+----------+--------------+  POP       Full            Yes       Yes                                    +---------+---------------+---------+-----------+----------+--------------+   Left Technical Findings: Not visualized segments include PTV, peroneal veins.   Summary: RIGHT: - There is no evidence of deep vein thrombosis in the lower extremity. However, portions of this examination were limited- see technologist comments above.  - No cystic structure found in the popliteal fossa.  LEFT: - There is no evidence of deep vein thrombosis in the lower extremity. However, portions of this examination were limited- see technologist comments above.  - No cystic structure found in the popliteal fossa.  *See table(s) above for measurements and observations. Electronically signed by Deitra Mayo MD on 08/19/2021 at 3:20:12 PM.    Final      PHYSICAL EXAM  Temp:  [97.5 F (36.4 C)-99.1 F (37.3 C)] 98.9 F (37.2 C) (01/23 1410) Pulse Rate:  [89-110] 105 (01/23 1440) Resp:  [15-20] 20 (01/23 1440) BP: (119-157)/(50-106) 132/91 (01/23 1440) SpO2:  [94 %-100 %] 94 %  (01/23 1440) Weight:  [121.4 kg] 121.4 kg (01/23 1306)  General - Well nourished, well developed, in no apparent distress.  Ophthalmologic - fundi not visualized due to noncooperation.  Cardiovascular - Regular rhythm and rate.  Mental Status -  Level of arousal and orientation to time, place, and person were intact. Language including expression, naming, repetition, comprehension was assessed and found intact. Fund of Knowledge was assessed and was intact.  Cranial Nerves II - XII - II - Visual field intact OU. III, IV, VI - Extraocular movements intact. V - Facial sensation intact bilaterally. VII - left facial droop, improved VIII - Hearing & vestibular intact bilaterally. X - Palate elevates symmetrically. XI - Chin turning & shoulder shrug intact bilaterally. XII - Tongue protrusion intact.  Motor Strength - The patients strength was 4/5 RUE proximal and distal, 4-/5 LUE proximal and 3/5 distal hand grip. RLE 3/5 proximal and 4/5 distal ankle PF/DF, LLE 2/5 proximal and 4-/5 distal ankle PF/DF.  Bulk was normal and fasciculations were absent.   Motor Tone - Muscle tone was assessed at the neck and appendages and was normal.  Reflexes - The patients reflexes were symmetrical in all extremities and she had no pathological reflexes.  Sensory - Light touch, temperature/pinprick were assessed and were symmetrical.    Coordination - The patient had mild dysmetria on the R FTN but ataxic on the L FTN.  Tremor was  absent.  Gait and Station - deferred.   ASSESSMENT/PLAN [name] is a 71 y.o. 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   Stroke:  right MCA scattered infarct embolic secondary to unclear source CT head no acute abnormalities MRI  Right MCA scattered infarcts MRA head and neck unremarkable LE venous doppler no DVT 2D Echo EF 60 to  65% TEE possible small PFO  Loop recorder placed LDL 47 HgbA1c 9.9 UDS neg Heparin subq for VTE prophylaxis aspirin 325 mg daily prior to admission, now on aspirin 81 mg daily and clopidogrel 75 mg daily DAPT for 3 weeks and then plavix alone. Patient counseled to be compliant with her antithrombotic medications Ongoing aggressive stroke risk factor management Therapy recommendations:  HH PT/OT Disposition:  pending  Hx of stroke 08/2020 with left sided weakness and numbness admitted to [facility]. MRI showed right parietal postcentral gyrus infarct. CTA head and neck unremarkable. EF 70%. LDL 71 and A1C 9.4. she was discharged on ASA 325 and lipitor 40. Post stroke it was thought that her stroke was caused by MM meds of Pomalidomide, it was discontinued by her oncologist, and changed to cyclophosphamide.  She followed with cardiology at [facility] for [place] and found to have 40 brief episode of SVT. Since asymptomatic, no treatment offered. Walk with walker at home  SVT She followed with cardiology at [facility] for [place] and found to have 40 brief episode of SVT. Since asymptomatic, no treatment offered. Loop recorder placed  MM Followed with [facility] oncology  Now on Daratumumab, cyclophosphamide and Dexamethasone Per pt, her MM is stable  Diabetes HgbA1c 9.9 goal < 7.0 Uncontrolled hyperglycemia CBG monitoring SSI DM education and close PCP follow up  Hypertension Stable Long term BP goal normotensive  Hyperlipidemia Home meds:  pravastatin 20  LDL 47, goal < 70 Now on pravastatin 20, no need high intensity statin given LDL at goal Continue statin at discharge  Other Stroke Risk Factors Advanced age Obesity, Body mass index is 40.69 kg/m.   Other Active Problems Baseline left sided weakness due to neuropathy   Hospital day # 1  Neurology will sign off. Please call with questions. Pt will follow up with stroke clinic NP at [facility] in about 4 weeks. Thanks for the  consult.   Rosalin Hawking, MD PhD Stroke Neurology 08/21/2021 5:17 PM    To contact Stroke Continuity provider, please refer to [email or web address] After hours, contact General Neurology

## 2021-08-21 NOTE — CV Procedure (Signed)
° °  Transesophageal Echocardiogram  Indications: Stroke  Time out performed  During this procedure the patient was administered propofol under anesthesiology supervision to achieve and maintain moderate sedation.  The patient's heart rate, blood pressure, and oxygen saturation are monitored continuously during the procedure.   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  Difficult to determine cardiac cycle timing secondary to port placement distal to septum.  Cannot exclude pulmonary AVM.   Candee Furbish, MD

## 2021-08-21 NOTE — Progress Notes (Signed)
PROGRESS NOTE  Sonya Poole HKV:[id number] DOB: 04-Sep-1950 DOA: 08/18/2021 PCP: System, Provider Not In  HPI/Recap of past 83 hours: 71 year old African-American female with a prior history of multiple myeloma currently being treated by [facility], history of type II diabetes, hypertension, morbid obesity, peripheral neuropathy, history of stroke with residual left-sided weakness presents to [facility] ER with sudden onset of slurred speech, lasting about 20 minutes.  Her husband brought her to the ER.  By the time she presented to the ER, her slurred speech had resolved.  Admitted for stroke work-up.    Work-up revealed CT head showing an old right parietal and occipital stroke.  This was seen on previous MRI done at [facility] in March 2022.  It also showed multiple round lytic lesions in her skull consistent with her history of multiple myeloma.  Seen by neurology/stroke team.  MRI brain revealed acute right MCA CVA with scattered infarcts.  MRA head and neck unremarkable.  Bilateral lower extremity Doppler ultrasound negative for DVT.  She was started on dual antiplatelets and statin.  Neurology/stroke team recommended TEE and loop recorder for embolic work-up.  Plan TEE and implantable loop recorder placement on 08/21/2021.     08/21/2021: Patient was seen at her bedside.  There were no acute events overnight.  She has no new complaints.  TEE 08/21/2021 by Dr. Marlou Porch, no left atrial appendage thrombus, small PFO.  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 MRI  Right MCA scattered infarcts MRA head and neck unremarkable LE venous doppler Negative for DVT. 2D Echo LVEF 60 to 56%, grade 1 diastolic dysfunction. LDL 47, at goal, goal less than 70 HgbA1c, 9.9, goal less than 7.0. UDS negative. Heparin subq for VTE prophylaxis aspirin 325 mg daily prior to admission, now on aspirin 81 mg daily and clopidogrel 75 mg daily DAPT for 3 weeks and then plavix alone. Patient counseled to be compliant with her antithrombotic medications Ongoing aggressive stroke risk factor management Therapy recommendations: Home health PT OT. Disposition: Likely to home with home health services once neurology/stroke team signs off.   Hx of stroke 08/2020 with left sided weakness and numbness. MRI showed right parietal postcentral gyrus infarct. CTA head and neck unremarkable. EF 70%. LDL 71 and A1C 9.4. she was discharged on ASA 325 and lipitor 40. Post stroke it was thought that her stroke was caused by MM meds of Pomalidomide, it was discontinued by her oncologist, and changed to cyclophosphamide.  She followed with cardiology for [place] and found to have 40 brief episode of SVT. Since asymptomatic, no treatment offered. Walk with walker at home   Multiple myeloma Followed with [facility] oncology  Now on Daratumumab, cyclophosphamide and Dexamethasone Per pt, her MM is stable  Type 2 diabetes with hyperglycemia. Hemoglobin A1c is 9.9, goal less than 7.0. Continues insulin sliding scale.  Ambulatory dysfunction poststroke PT OT assessed and recommended home health PT OT Continue fall precautions   CKD 3B Appears to be at her baseline creatinine 1.2 with GFR 46 Continue to avoid nephrotoxic  agents, dehydration and hypotension.  Chronic peripheral neuropathy Continue home gabapentin  Hypertension Permissive hypertension Goal normotensive BP next 3 days Continue to closely monitor vital signs.   Code Status:  Full code  Family Communication: None at bedside.  Updated her husband via phone on 08/19/2021.  Disposition Plan: Plan to discharge to home with home health services.   Consultants: Neurology/stroke team. Cardiology EP  Procedures: None.  Antimicrobials: None.  DVT prophylaxis: Subcu heparin 3 times daily  Status is: Inpatient  Patient requires at least 2 midnights for further evaluation and treatment of present condition.      Objective: Vitals:   08/21/21 0321 08/21/21 0804 08/21/21 1109 08/21/21 1306  BP: (!) 119/53 (!) 131/58 (!) 154/95 (!) 157/86  Pulse: 89 94 97 94  Resp: _0 Temp: 98.2 F (36.8 C) 98.4 F (36.9 C) (!) 97.5 F (36.4 C) 98 F (36.7 C)  TempSrc: Oral Oral Oral Temporal  SpO2: 100% 100% 99% 99%  Weight:    121.4 kg  Height:    5' 8" (1.727 m)    Intake/Output Summary (Last 24 hours) at 08/21/2021 1406 Last data filed at 08/21/2021 0000 Gross per 24 hour  Intake 480 ml  Output 550 ml  Net -70 ml   Filed Weights   08/18/21 2354 08/19/21 1408 08/21/21 1306  Weight: 117.9 kg 121.4 kg 121.4 kg    Exam:  General: 71 y.o. year-old female frail-appearing no acute distress.  She is alert and oriented x3. Cardiovascular: Regular rate and rhythm no rubs or gallops.   Respiratory: Clear to auscultation no wheezes or rales.   Abdomen: Soft nontender bowel sounds  musculoskeletal: No lower extremity edema bilaterally. Skin: No ulcerative lesions noted. Psychiatry: Mood is appropriate for condition and setting. Neuro: Mild dysarthria.  4 out of 5 strength in upper extremities bilaterally.  3 out of 5 strength in lower extremities bilaterally.  Sensation is intact.   Data Reviewed: CBC: Recent Labs  Lab 08/19/21 0008  WBC 6.9  NEUTROABS 5.3  HGB 11.7*  HCT 36.2  MCV 85.2  PLT 588   Basic Metabolic Panel: Recent Labs  Lab 08/19/21 0008  NA 140  K 4.1  CL 104  CO2 25  GLUCOSE 328*  BUN 10  CREATININE 1.27*  CALCIUM  9.0   GFR: Estimated Creatinine Clearance: 58.2 mL/min (A) (by C-G formula based on SCr of 1.27 mg/dL (H)). Liver Function Tests: Recent Labs  Lab 08/19/21 0008  AST 23  ALT 11  ALKPHOS 109  BILITOT 0.9  PROT 6.1*  ALBUMIN 3.2*   No results for input(s): LIPASE, AMYLASE in the last 168 hours. No results for input(s): AMMONIA in the last 168 hours. Coagulation Profile: Recent Labs  Lab 08/19/21 0008  INR 1.0   Cardiac Enzymes: No results for input(s): CKTOTAL, CKMB, CKMBINDEX, TROPONINI in the last 168 hours. BNP (last 3 results) No results for input(s): PROBNP in the last 8760 hours. HbA1C: Recent Labs    08/19/21 0550  HGBA1C 9.9*   CBG: Recent Labs  Lab 08/20/21 1214 08/20/21 1617 08/20/21 2106 08/21/21 0622 08/21/21 1116  GLUCAP 190* 240* 145* 214* 196*   Lipid Profile: Recent Labs    08/19/21 0550  CHOL 118  HDL 47  LDLCALC 47  TRIG 118  CHOLHDL 2.5   Thyroid Function Tests: No results for input(s): TSH, T4TOTAL, FREET4, T3FREE, THYROIDAB in the last 72 hours. Anemia Panel: No results for input(s): VITAMINB12, FOLATE, FERRITIN, TIBC, IRON,  RETICCTPCT in the last 72 hours. Urine analysis:    Component Value Date/Time   COLORURINE AMBER (A) 08/19/2021 0555   APPEARANCEUR CLOUDY (A) 08/19/2021 0555   LABSPEC 1.021 08/19/2021 0555   PHURINE 5.0 08/19/2021 0555   GLUCOSEU >=500 (A) 08/19/2021 0555   HGBUR NEGATIVE 08/19/2021 0555   BILIRUBINUR NEGATIVE 08/19/2021 0555   KETONESUR 5 (A) 08/19/2021 0555   PROTEINUR 100 (A) 08/19/2021 0555   NITRITE NEGATIVE 08/19/2021 0555   LEUKOCYTESUR LARGE (A) 08/19/2021 0555   Sepsis Labs: _0 (procalcitonin:4,lacticidven:4)  ) Recent Results (from the past 240 hour(s))  Resp Panel by RT-PCR (Flu A&B, Covid) Nasopharyngeal Swab     Status: None   Collection Time: 08/19/21 12:15 AM   Specimen: Nasopharyngeal Swab; Nasopharyngeal(NP) swabs in vial transport medium  Result Value Ref Range Status    SARS Coronavirus 2 by RT PCR NEGATIVE NEGATIVE Final    Comment: (NOTE) SARS-CoV-2 target nucleic acids are NOT DETECTED.  The SARS-CoV-2 RNA is generally detectable in upper respiratory specimens during the acute phase of infection. The lowest concentration of SARS-CoV-2 viral copies this assay can detect is 138 copies/mL. A negative result does not preclude SARS-Cov-2 infection and should not be used as the sole basis for treatment or other patient management decisions. A negative result may occur with  improper specimen collection/handling, submission of specimen other than nasopharyngeal swab, presence of viral mutation(s) within the areas targeted by this assay, and inadequate number of viral copies(<138 copies/mL). A negative result must be combined with clinical observations, patient history, and epidemiological information. The expected result is Negative.  Fact Sheet for Patients:  EntrepreneurPulse.com.au  Fact Sheet for Healthcare Providers:  IncredibleEmployment.be  This test is no t yet approved or cleared by the [place] FDA and  has been authorized for detection and/or diagnosis of SARS-CoV-2 by FDA under an Emergency Use Authorization (EUA). This EUA will remain  in effect (meaning this test can be used) for the duration of the COVID-19 declaration under Section 564(b)(1) of the Act, 21 U.S.C.section 360bbb-3(b)(1), unless the authorization is terminated  or revoked sooner.       Influenza A by PCR NEGATIVE NEGATIVE Final   Influenza B by PCR NEGATIVE NEGATIVE Final    Comment: (NOTE) The Xpert Xpress SARS-CoV-2/FLU/RSV plus assay is intended as an aid in the diagnosis of influenza from Nasopharyngeal swab specimens and should not be used as a sole basis for treatment. Nasal washings and aspirates are unacceptable for Xpert Xpress SARS-CoV-2/FLU/RSV testing.  Fact Sheet for  Patients: EntrepreneurPulse.com.au  Fact Sheet for Healthcare Providers: IncredibleEmployment.be  This test is not yet approved or cleared by the [place] FDA and has been authorized for detection and/or diagnosis of SARS-CoV-2 by FDA under an Emergency Use Authorization (EUA). This EUA will remain in effect (meaning this test can be used) for the duration of the COVID-19 declaration under Section 564(b)(1) of the Act, 21 U.S.C. section 360bbb-3(b)(1), unless the authorization is terminated or revoked.  Performed at [facility], [place] [address] [place], [place] [address]       Studies: No results found.  Scheduled Meds:  [MAR Hold] aspirin EC  81 mg Oral Daily   [MAR Hold] Chlorhexidine Gluconate Cloth  6 each Topical Daily   [MAR Hold] clopidogrel  75 mg Oral Daily   [MAR Hold] gabapentin  900 mg Oral BID   [MAR Hold] heparin  5,000 Units Subcutaneous Q8H   [MAR Hold] insulin aspart  0-15 Units Subcutaneous  TID WC   [MAR Hold] insulin aspart  0-5 Units Subcutaneous QHS   [MAR Hold] nortriptyline  10 mg Oral QHS   [MAR Hold] pravastatin  20 mg Oral Daily    Continuous Infusions:   LOS: 1 day     Kayleen Memos, MD [facility] Pager [phone]  If 7PM-7AM, please contact night-coverage [email or web address] Password [facility] 08/21/2021, 2:06 PM

## 2021-08-21 NOTE — Transfer of Care (Signed)
Immediate Anesthesia Transfer of Care Note  Patient: Sonya Poole  Procedure(s) Performed: TRANSESOPHAGEAL ECHOCARDIOGRAM (TEE) BUBBLE STUDY  Patient Location: PACU  Anesthesia Type:MAC  Level of Consciousness: drowsy and patient cooperative  Airway & Oxygen Therapy: Patient Spontanous Breathing and Patient connected to face mask oxygen  Post-op Assessment: Report given to RN and Post -op Vital signs reviewed and stable  Post vital signs: Reviewed and stable  Last Vitals:  Vitals Value Taken Time  BP 133/106 08/21/21 1409  Temp    Pulse 111 08/21/21 1411  Resp 19 08/21/21 1411  SpO2 100 % 08/21/21 1411  Vitals shown include unvalidated device data.  Last Pain:  Vitals:   08/21/21 1306  TempSrc: Temporal  PainSc: 0-No pain      Patients Stated Pain Goal: 0 (10/09/79 1886)  Complications: No notable events documented.

## 2021-08-21 NOTE — Progress Notes (Signed)
STROKE TEAM PROGRESS NOTE    SUBJECTIVE (INTERVAL HISTORY) Her husband is at the bedside.  Overall her condition is stable. Pt has obvious left facial droop and she stated that her left side was weaker than normal baseline and she hardly able to walk with walker today. Her slurry speech remains but seems better than at home last night.    Per husband and pt, pt had slurry speech last night while in bed, and EMS transferred her from bed to stretcher so they did not check left side strength but today pt felt it was weaker than normal baseline.    OBJECTIVE Temp:  [98.2 F (36.8 C)-98.8 F (37.1 C)] 98.8 F (37.1 C) (01/21 1200) Pulse Rate:  [98-114] 109 (01/21 1200) Cardiac Rhythm: Sinus tachycardia (01/21 0140) Resp:  [13-30] 19 (01/21 1200) BP: (118-143)/(56-98) 118/86 (01/21 1200) SpO2:  [95 %-100 %] 95 % (01/21 1200) Weight:  [117.9 kg] 117.9 kg (01/20 2354)   Last Labs      Recent Labs  Lab 08/19/21 0907  GLUCAP 209*      Last Labs      Recent Labs  Lab 08/19/21 0008  NA 140  K 4.1  CL 104  CO2 25  GLUCOSE 328*  BUN 10  CREATININE 1.27*  CALCIUM 9.0      Last Labs      Recent Labs  Lab 08/19/21 0008  AST 23  ALT 11  ALKPHOS 109  BILITOT 0.9  PROT 6.1*  ALBUMIN 3.2*      Last Labs      Recent Labs  Lab 08/19/21 0008  WBC 6.9  NEUTROABS 5.3  HGB 11.7*  HCT 36.2  MCV 85.2  PLT 162      Last Labs   No results for input(s): CKTOTAL, CKMB, CKMBINDEX, TROPONINI in the last 168 hours.   Recent Labs (last 2 labs)      Recent Labs    08/19/21 0008  LABPROT 13.4  INR 1.0      Recent Labs (last 2 labs)      Recent Labs    08/19/21 0555  COLORURINE AMBER*  LABSPEC 1.021  PHURINE 5.0  GLUCOSEU >=500*  HGBUR NEGATIVE  BILIRUBINUR NEGATIVE  KETONESUR 5*  PROTEINUR 100*  NITRITE NEGATIVE  LEUKOCYTESUR LARGE*      Labs (Brief)          Component Value Date/Time    CHOL 118 08/19/2021 0550    TRIG 118 08/19/2021 0550    HDL 47  08/19/2021 0550    CHOLHDL 2.5 08/19/2021 0550    VLDL 24 08/19/2021 0550    LDLCALC 47 08/19/2021 0550      Recent Labs  No results found for: HGBA1C   Labs (Brief)          Component Value Date/Time    LABOPIA NONE DETECTED 08/19/2021 0555    COCAINSCRNUR NONE DETECTED 08/19/2021 0555    LABBENZ NONE DETECTED 08/19/2021 0555    AMPHETMU NONE DETECTED 08/19/2021 0555    THCU NONE DETECTED 08/19/2021 0555    LABBARB NONE DETECTED 08/19/2021 0555      Last Labs      Recent Labs  Lab 08/19/21 0015  ETH <10        I have personally reviewed the radiological images below and agree with the radiology interpretations.    Imaging Results  CT HEAD WO CONTRAST   Result Date: 08/19/2021 CLINICAL DATA:  Sudden onset of expressive aphasia. EXAM: CT  HEAD WITHOUT CONTRAST TECHNIQUE: Contiguous axial images were obtained from the base of the skull through the vertex without intravenous contrast. RADIATION DOSE REDUCTION: This exam was performed according to the departmental dose-optimization program which includes automated exposure control, adjustment of the mA and/or kV according to patient size and/or use of iterative reconstruction technique. COMPARISON:  None. FINDINGS: Brain: There is mild cerebral atrophy with widening of the extra-axial spaces and ventricular dilatation. There are areas of decreased attenuation within the white matter tracts of the supratentorial brain, consistent with microvascular disease changes. Small chronic right anterior parietal and right occipital lobe infarcts are seen. Vascular: No hyperdense vessel or unexpected calcification. Skull: Negative for acute fracture. Innumerable benign appearing subcentimeter round lytic areas are seen throughout the skull. Sinuses/Orbits: No acute finding. Other: None. IMPRESSION: 1. No acute intracranial abnormality. 2. Small chronic right anterior parietal and right occipital lobe infarcts. 3. Generalized cerebral atrophy  Electronically Signed   By: Virgina Norfolk M.D.   On: 08/19/2021 01:09    MR ANGIO HEAD WO CONTRAST   Result Date: 08/19/2021 CLINICAL DATA:  71 year old female TIA. EXAM: MRA HEAD WITHOUT CONTRAST TECHNIQUE: Angiographic images of the Circle of Willis were acquired using MRA technique without intravenous contrast. COMPARISON:  Noncontrast head CT 0054 hours today. FINDINGS: Anterior circulation: Antegrade flow in both ICA siphons. Mild siphon irregularity. No siphon stenosis. Normal bilateral posterior communicating artery origins. Patent carotid termini, MCA and ACA origins. Diminutive or absent anterior communicating artery. Visible bilateral ACA branches are within normal limits. Bilateral MCA M1 segments and MCA bifurcations are patent. Visible bilateral MCA branches are within normal limits. Posterior circulation: Antegrade flow in the posterior circulation with codominant distal vertebral arteries. Patent left PICA and dominant appearing right AICA origins. No distal vertebral or vertebrobasilar junction stenosis. Patent basilar artery without stenosis. Patent SCA and PCA origins. Tortuous right PCA P1 segment. Fetal type left PCA origin. Right posterior communicating artery present but smaller. Bilateral PCA branches are within normal limits. Anatomic variants: Fetal type left PCA origin. Other: Abnormal susceptibility in the posterior right greater than left cerebellum, follow-up MRI brain pending at this time. No intracranial mass effect or ventriculomegaly. IMPRESSION: 1. Negative intracranial MRA. 2. Brain MRI pending, reported separately. Electronically Signed   By: Genevie Ann M.D.   On: 08/19/2021 08:23    MR ANGIO NECK WO CONTRAST   Result Date: 08/19/2021 CLINICAL DATA:  71 year old female with TIA. Patchy acute right MCA and acute to subacute right PCA territory infarcts on brain MRI today. EXAM: MRA NECK WITHOUT CONTRAST TECHNIQUE: Angiographic images of the neck were acquired using MRA  technique without intravenous contrast. Carotid stenosis measurements (when applicable) are obtained utilizing NASCET criteria, using the distal internal carotid diameter as the denominator. COMPARISON:  Brain MRI and intracranial MRA today reported separately. FINDINGS: 3D time-of-flight imaging. Mild motion degradation. Limited detail of the aortic arch and proximal great vessels. Partially retropharyngeal course of the right carotid in the neck with preserved antegrade right carotid flow to the skull base. No evidence of hemodynamically significant stenosis. Antegrade left carotid flow in the neck to the skull base. Tortuous left ICA distal to the bulb, but no evidence of hemodynamically significant stenosis. Codominant appearing cervical vertebral arteries with antegrade flow signal to the vertebrobasilar junction. Proximal vertebral artery detail limited by motion, but no evidence of hemodynamically significant stenosis. Visible intracranial arteries appear stable from the earlier circle-of-Willis MRA. IMPRESSION: 1. No evidence of hemodynamically significant cervical carotid or vertebral artery  stenosis. 2. Tortuous carotid arteries, retropharyngeal on the right. Electronically Signed   By: Genevie Ann M.D.   On: 08/19/2021 09:05    MR BRAIN WO CONTRAST   Result Date: 08/19/2021 CLINICAL DATA:  71 year old female TIA. EXAM: MRI HEAD WITHOUT CONTRAST TECHNIQUE: Multiplanar, multiecho pulse sequences of the brain and surrounding structures were obtained without intravenous contrast. COMPARISON:  Head CT 0054 hours today. Intracranial MRA today. FINDINGS: Brain: Patchy, scattered cortical restricted diffusion in the right frontal operculum and tracking in the mid right middle and superior frontal gyrus, pre motor area (series 2 images 32 through 38). Superimposed chronic right perirolandic cortical encephalomalacia and confluent hemosiderin (series 7, image 75). Occasional right MCA territory punctate white  matter restricted diffusion. Superimposed patchy right occipital pole cortical restricted diffusion (series 2, image 25). Cytotoxic edema in the affected areas with no acute hemorrhage or mass effect. Extensive superimposed bilateral cerebral white matter T2 and FLAIR hyperintensity including areas most resembling cystic encephalomalacia from previous white matter infarcts. Similar T2 heterogeneity in the right thalamus. Patchy chronic encephalomalacia in the posterior cerebellum greater on the right. Chronic microhemorrhage in the left cerebellum. Chronic microhemorrhage in the right external capsule. No midline shift, mass effect, evidence of mass lesion, ventriculomegaly, extra-axial collection or acute intracranial hemorrhage. Cervicomedullary junction and pituitary are within normal limits. Vascular: Major intracranial vascular flow voids are preserved. Skull and upper cervical spine: Negative visible cervical spine. Visualized bone marrow signal is within normal limits. Sinuses/Orbits: Negative. Other: Grossly normal visible internal auditory structures. Negative visible scalp and face. IMPRESSION: 1. Patchy acute infarcts in the Right MCA middle division, including pre motor involvement. And smaller acute to subacute patchy infarct in the Right PCA territory, right occipital pole. No acute hemorrhage or mass effect. 2. 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 This includes chronic right perirolandic encephalomalacia with abundant hemosiderin, and chronic posterior cerebellar infarcts, corresponding to susceptibility seen on earlier MRA. Electronically Signed   By: Genevie Ann M.D.   On: 08/19/2021 08:39    DG Chest Portable 1 View   Result Date: 08/19/2021 CLINICAL DATA:  Port placement. EXAM: PORTABLE CHEST 1 VIEW COMPARISON:  None. FINDINGS: The heart size and mediastinal contours are within normal limits. The lungs are clear without effusions or infiltrates. No pneumothorax is seen.  No acute osseous abnormality. A right internal jugular chest port terminates over the right atrium. IMPRESSION: Right chest port terminates over the right atrium. Electronically Signed   By: Brett Fairy M.D.   On: 08/19/2021 04:55         PHYSICAL EXAM   Temp:  [98.2 F (36.8 C)-98.8 F (37.1 C)] 98.8 F (37.1 C) (01/21 1200) Pulse Rate:  [98-114] 109 (01/21 1200) Resp:  [13-30] 19 (01/21 1200) BP: (118-143)/(56-98) 118/86 (01/21 1200) SpO2:  [95 %-100 %] 95 % (01/21 1200) Weight:  [117.9 kg] 117.9 kg (01/20 2354)   General - Well nourished, well developed, in no apparent distress.   Ophthalmologic - fundi not visualized due to noncooperation.   Cardiovascular - Regular rhythm and rate.   Mental Status -  Level of arousal and orientation to time, place, and person were intact. Language including expression, naming, repetition, comprehension was assessed and found intact. Fund of Knowledge was assessed and was intact.   Cranial Nerves II - XII - II - Visual field intact OU. III, IV, VI - Extraocular movements intact. V - Facial sensation intact bilaterally. VII - left facial droop VIII - Hearing &  vestibular intact bilaterally. X - Palate elevates symmetrically. XI - Chin turning & shoulder shrug intact bilaterally. XII - Tongue protrusion intact.   Motor Strength - The patients strength was 4/5 RUE proximal and distal, 4-/5 LUE proximal and 3/5 distal hand grip. RLE 3/5 proximal and 4/5 distal ankle PF/DF, LLE 2/5 proximal and 4-/5 distal ankle PF/DF.  Bulk was normal and fasciculations were absent.   Motor Tone - Muscle tone was assessed at the neck and appendages and was normal.   Reflexes - The patients reflexes were symmetrical in all extremities and she had no pathological reflexes.   Sensory - Light touch, temperature/pinprick were assessed and were symmetrical.     Coordination - The patient had mild dysmetria on the R FTN but ataxic on the L FTN.  Tremor was  absent.   Gait and Station - deferred.     ASSESSMENT/PLAN Ms. Juliza Machnik is a 71 y.o. 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    Stroke:  right MCA scattered infarct embolic secondary to unclear source CT head no acute abnormalities MRI  Right MCA scattered infarcts MRA head and neck unremarkable LE venous doppler pending 2D Echo  pending Consider TEE and loop recorder LDL 47 HgbA1c pending UDS neg Heparin subq for VTE prophylaxis aspirin 325 mg daily prior to admission, now on aspirin 81 mg daily and clopidogrel 75 mg daily DAPT for 3 weeks and then plavix alone. Patient counseled to be compliant with her antithrombotic medications Ongoing aggressive stroke risk factor management Therapy recommendations:  pending Disposition:  pending   Hx of stroke 08/2020 with left sided weakness and numbness. MRI showed right parietal postcentral gyrus infarct. CTA head and neck unremarkable. EF 70%. LDL 71 and A1C 9.4. she was discharged on ASA 325 and lipitor 40. Post stroke it was thought that her stroke was caused by MM meds of Pomalidomide, it was discontinued by her oncologist, and changed to cyclophosphamide.  She followed with cardiology for Ziopatch and found to have 40 brief episode of SVT. Since asymptomatic, no treatment offered. Walk with walker at home   MM Followed with [facility] oncology  Now on Daratumumab, cyclophosphamide and Dexamethasone Per pt, her MM is stable   Diabetes HgbA1c pending goal < 7.0 Uncontrolled hyperglycemia CBG monitoring SSI DM education and close PCP follow up   Hypertension Stable Long term BP goal normotensive   Hyperlipidemia Home meds:  pravastatin 20  LDL 47, goal < 70 Now on pravastatin, no need high intensity statin given LDL at goal Continue statin at discharge   Other Stroke Risk Factors Advanced  age Obesity, Body mass index is 39.53 kg/m.    Other Active Problems Baseline left sided weakness due to neuropathy    Hospital day # 0    discussed with Dr. Nevada Crane. I spent  extensive time in counseling and coordination of care, reviewing test results, images and medication, and discussing the diagnosis, treatment plan and potential prognosis. This patient's care requiresreview of multiple databases, neurological assessment, discussion with family, other specialists and medical decision making of high complexity. I had long discussion with pt and husband at bedside, updated pt current condition, treatment plan and potential prognosis, and answered all the questions. They expressed understanding and appreciation.        Rosalin Hawking, MD PhD Stroke Neurology 08/19/2021 12:40 PM  To contact Stroke Continuity provider, please refer to [email or web address] After hours, contact General Neurology

## 2021-08-21 NOTE — Consult Note (Addendum)
ELECTROPHYSIOLOGY CONSULT NOTE  Patient ID: Sonya Poole MRN: [id number], DOB/AGE: Oct 09, 1950   Admit date: 08/18/2021 Date of Consult: 08/21/2021  Primary Physician: System, Provider Not In Primary Cardiologist: None  Primary Electrophysiologist: New to Dr. Quentin Ore Reason for Consultation: Cryptogenic stroke; recommendations regarding Implantable Loop Recorder Insurance: [facility] Medicare  History of Present Illness EP has been asked to evaluate Sonya Poole for placement of an implantable loop recorder to monitor for atrial fibrillation by Dr Erlinda Hong.  The patient was admitted on 08/18/2021 with slurred speech, left facial droop, and left sided weakness worse than prior (with previous stroke).    Imaging demonstrated right MCA scattered infarct embolic secondary to unclear source.    She has undergone workup for stroke:   Stroke:  right MCA scattered infarct embolic secondary to unclear source CT head no acute abnormalities MRI  Right MCA scattered infarcts MRA head and neck unremarkable LE venous doppler no DVT 2D Echo EF 60 to 65% Will need TEE and loop recorder for embolic work-up LDL 47 [id number] pending UDS neg Heparin subq for VTE prophylaxis aspirin 325 mg daily prior to admission, now on aspirin 81 mg daily and clopidogrel 75 mg daily DAPT for 3 weeks and then plavix alone. Patient counseled to be compliant with her antithrombotic medications Ongoing aggressive stroke risk factor management Therapy recommendations:  HH PT/OT Disposition:  pending   The patient has been monitored on telemetry which has demonstrated sinus rhythm with no arrhythmias.  Inpatient stroke work-up will require a TEE per Neurology.   Echocardiogram as above. Lab work is reviewed.  Prior to admission, the patient denies chest pain, shortness of breath, dizziness, or syncope.  She is recovering from her stroke with plans to return home  at discharge.  She has had palpitations with SVT previously  identified on Zio.    Past Medical History:  Diagnosis Date   Asthma    Cancer ([place])    Diabetes mellitus without complication ([place])    Hypertension    Immune deficiency disorder ([place])    Peripheral neuropathy    Stroke [facility])      Surgical History:  Past Surgical History:  Procedure Laterality Date   CHOLECYSTECTOMY     intestine remove       Medications Prior to Admission  Medication Sig Dispense Refill Last Dose   acyclovir (ZOVIRAX) 400 MG tablet Take 400 mg by mouth 2 (two) times daily.   08/18/2021   amLODipine (NORVASC) 10 MG tablet Take 10 mg by mouth daily.   08/18/2021   aspirin EC 325 MG tablet Take 325 mg by mouth daily.   0/17/5102   folic acid (FOLVITE) 1 MG tablet Take 1 tablet by mouth daily.   08/18/2021   gabapentin (NEURONTIN) 300 MG capsule Take 900 mg by mouth in the morning and at bedtime.   08/18/2021   Insulin Glargine-Lixisenatide 100-33 UNT-MCG/ML SOPN Inject 38 Units into the skin daily as needed. If blood is 250 or higher   08/17/2021   lidocaine-prilocaine (EMLA) cream Apply 1 application topically as needed (port site access every other wednesday).   08/09/2021   metFORMIN (GLUCOPHAGE-XR) 500 MG 24 hr tablet Take 500 mg by mouth 2 (two) times daily.   08/18/2021   nortriptyline (PAMELOR) 10 MG capsule Take 10 mg by mouth at bedtime.   08/18/2021   omeprazole (PRILOSEC) 20 MG capsule Take 20 mg by mouth daily.   08/18/2021   oxyCODONE (OXY IR/ROXICODONE) 5 MG immediate release tablet Take  5 mg by mouth daily as needed for moderate pain.   08/18/2021   potassium chloride (MICRO-K) 10 MEQ CR capsule Take 10 mEq by mouth 2 (two) times daily.   08/18/2021   pravastatin (PRAVACHOL) 20 MG tablet Take 20 mg by mouth daily.   08/18/2021    Inpatient Medications:   aspirin EC  81 mg Oral Daily   Chlorhexidine Gluconate Cloth  6 each Topical Daily   clopidogrel  75 mg Oral Daily   gabapentin  900 mg Oral BID   heparin  5,000 Units Subcutaneous Q8H   insulin aspart   0-15 Units Subcutaneous TID WC   insulin aspart  0-5 Units Subcutaneous QHS   nortriptyline  10 mg Oral QHS   pravastatin  20 mg Oral Daily    Allergies:  Allergies  Allergen Reactions   Lisinopril Swelling    Social History   Socioeconomic History   Marital status: Married    Spouse name: Not on file   Number of children: Not on file   Years of education: Not on file   Highest education level: Not on file  Occupational History   Not on file  Tobacco Use   Smoking status: Never   Smokeless tobacco: Never  Substance and Sexual Activity   Alcohol use: Yes    Alcohol/week: 1.0 standard drink    Types: 1 Glasses of wine per week   Drug use: Never   Sexual activity: Yes    Birth control/protection: None  Other Topics Concern   Not on file  Social History Narrative   Not on file   Social Determinants of Health   Financial Resource Strain: Not on file  Food Insecurity: Not on file  Transportation Needs: Not on file  Physical Activity: Not on file  Stress: Not on file  Social Connections: Not on file  Intimate Partner Violence: Not on file     History reviewed. No pertinent family history.    Review of Systems: All other systems reviewed and are otherwise negative except as noted above.  Physical Exam: Vitals:   08/20/21 2107 08/20/21 2338 08/21/21 0321 08/21/21 0804  BP: (!) 136/50 (!) 141/53 (!) 119/53 (!) 131/58  Pulse: 93 94 89 94  Resp: 18 16 17 17   Temp: 99.1 F (37.3 C) 98.7 F (37.1 C) 98.2 F (36.8 C) 98.4 F (36.9 C)  TempSrc: Oral Oral Oral Oral  SpO2: 98% 96% 100% 100%  Weight:      Height:        GEN- The patient is well appearing, alert and oriented x 3 today.   Head- normocephalic, atraumatic Eyes-  Sclera clear, conjunctiva pink Ears- hearing intact Oropharynx- clear Neck- supple Lungs- Clear to ausculation bilaterally, normal work of breathing Heart- Regular rate and rhythm, no murmurs, rubs or gallops  GI- soft, NT, ND, +  BS Extremities- no clubbing, cyanosis, or edema MS- no significant deformity or atrophy Skin- no rash or lesion Psych- euthymic mood, full affect   Labs:   Lab Results  Component Value Date   WBC 6.9 08/19/2021   HGB 11.7 (L) 08/19/2021   HCT 36.2 08/19/2021   MCV 85.2 08/19/2021   PLT 162 08/19/2021    Recent Labs  Lab 08/19/21 0008  NA 140  K 4.1  CL 104  CO2 25  BUN 10  CREATININE 1.27*  CALCIUM 9.0  PROT 6.1*  BILITOT 0.9  ALKPHOS 109  ALT 11  AST 23  GLUCOSE 328*  Radiology/Studies: CT HEAD WO CONTRAST  Result Date: 08/19/2021 CLINICAL DATA:  Sudden onset of expressive aphasia. EXAM: CT HEAD WITHOUT CONTRAST TECHNIQUE: Contiguous axial images were obtained from the base of the skull through the vertex without intravenous contrast. RADIATION DOSE REDUCTION: This exam was performed according to the departmental dose-optimization program which includes automated exposure control, adjustment of the mA and/or kV according to patient size and/or use of iterative reconstruction technique. COMPARISON:  None. FINDINGS: Brain: There is mild cerebral atrophy with widening of the extra-axial spaces and ventricular dilatation. There are areas of decreased attenuation within the white matter tracts of the supratentorial brain, consistent with microvascular disease changes. Small chronic right anterior parietal and right occipital lobe infarcts are seen. Vascular: No hyperdense vessel or unexpected calcification. Skull: Negative for acute fracture. Innumerable benign appearing subcentimeter round lytic areas are seen throughout the skull. Sinuses/Orbits: No acute finding. Other: None. IMPRESSION: 1. No acute intracranial abnormality. 2. Small chronic right anterior parietal and right occipital lobe infarcts. 3. Generalized cerebral atrophy Electronically Signed   By: Virgina Norfolk M.D.   On: 08/19/2021 01:09   MR ANGIO HEAD WO CONTRAST  Result Date: 08/19/2021 CLINICAL DATA:   71 year old female TIA. EXAM: MRA HEAD WITHOUT CONTRAST TECHNIQUE: Angiographic images of the Circle of Willis were acquired using MRA technique without intravenous contrast. COMPARISON:  Noncontrast head CT 0054 hours today. FINDINGS: Anterior circulation: Antegrade flow in both ICA siphons. Mild siphon irregularity. No siphon stenosis. Normal bilateral posterior communicating artery origins. Patent carotid termini, MCA and ACA origins. Diminutive or absent anterior communicating artery. Visible bilateral ACA branches are within normal limits. Bilateral MCA M1 segments and MCA bifurcations are patent. Visible bilateral MCA branches are within normal limits. Posterior circulation: Antegrade flow in the posterior circulation with codominant distal vertebral arteries. Patent left PICA and dominant appearing right AICA origins. No distal vertebral or vertebrobasilar junction stenosis. Patent basilar artery without stenosis. Patent SCA and PCA origins. Tortuous right PCA P1 segment. Fetal type left PCA origin. Right posterior communicating artery present but smaller. Bilateral PCA branches are within normal limits. Anatomic variants: Fetal type left PCA origin. Other: Abnormal susceptibility in the posterior right greater than left cerebellum, follow-up MRI brain pending at this time. No intracranial mass effect or ventriculomegaly. IMPRESSION: 1. Negative intracranial MRA. 2. Brain MRI pending, reported separately. Electronically Signed   By: Genevie Ann M.D.   On: 08/19/2021 08:23   MR ANGIO NECK WO CONTRAST  Result Date: 08/19/2021 CLINICAL DATA:  71 year old female with TIA. Patchy acute right MCA and acute to subacute right PCA territory infarcts on brain MRI today. EXAM: MRA NECK WITHOUT CONTRAST TECHNIQUE: Angiographic images of the neck were acquired using MRA technique without intravenous contrast. Carotid stenosis measurements (when applicable) are obtained utilizing NASCET criteria, using the distal internal  carotid diameter as the denominator. COMPARISON:  Brain MRI and intracranial MRA today reported separately. FINDINGS: 3D time-of-flight imaging. Mild motion degradation. Limited detail of the aortic arch and proximal great vessels. Partially retropharyngeal course of the right carotid in the neck with preserved antegrade right carotid flow to the skull base. No evidence of hemodynamically significant stenosis. Antegrade left carotid flow in the neck to the skull base. Tortuous left ICA distal to the bulb, but no evidence of hemodynamically significant stenosis. Codominant appearing cervical vertebral arteries with antegrade flow signal to the vertebrobasilar junction. Proximal vertebral artery detail limited by motion, but no evidence of hemodynamically significant stenosis. Visible intracranial arteries appear stable from the  earlier circle-of-Willis MRA. IMPRESSION: 1. No evidence of hemodynamically significant cervical carotid or vertebral artery stenosis. 2. Tortuous carotid arteries, retropharyngeal on the right. Electronically Signed   By: Genevie Ann M.D.   On: 08/19/2021 09:05   MR BRAIN WO CONTRAST  Result Date: 08/19/2021 CLINICAL DATA:  71 year old female TIA. EXAM: MRI HEAD WITHOUT CONTRAST TECHNIQUE: Multiplanar, multiecho pulse sequences of the brain and surrounding structures were obtained without intravenous contrast. COMPARISON:  Head CT 0054 hours today. Intracranial MRA today. FINDINGS: Brain: Patchy, scattered cortical restricted diffusion in the right frontal operculum and tracking in the mid right middle and superior frontal gyrus, pre motor area (series 2 images 32 through 38). Superimposed chronic right perirolandic cortical encephalomalacia and confluent hemosiderin (series 7, image 75). Occasional right MCA territory punctate white matter restricted diffusion. Superimposed patchy right occipital pole cortical restricted diffusion (series 2, image 25). Cytotoxic edema in the affected areas  with no acute hemorrhage or mass effect. Extensive superimposed bilateral cerebral white matter T2 and FLAIR hyperintensity including areas most resembling cystic encephalomalacia from previous white matter infarcts. Similar T2 heterogeneity in the right thalamus. Patchy chronic encephalomalacia in the posterior cerebellum greater on the right. Chronic microhemorrhage in the left cerebellum. Chronic microhemorrhage in the right external capsule. No midline shift, mass effect, evidence of mass lesion, ventriculomegaly, extra-axial collection or acute intracranial hemorrhage. Cervicomedullary junction and pituitary are within normal limits. Vascular: Major intracranial vascular flow voids are preserved. Skull and upper cervical spine: Negative visible cervical spine. Visualized bone marrow signal is within normal limits. Sinuses/Orbits: Negative. Other: Grossly normal visible internal auditory structures. Negative visible scalp and face. IMPRESSION: 1. Patchy acute infarcts in the Right MCA middle division, including pre motor involvement. And smaller acute to subacute patchy infarct in the Right PCA territory, right occipital pole. No acute hemorrhage or mass effect. 2. 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 This includes chronic right perirolandic encephalomalacia with abundant hemosiderin, and chronic posterior cerebellar infarcts, corresponding to susceptibility seen on earlier MRA. Electronically Signed   By: Genevie Ann M.D.   On: 08/19/2021 08:39   DG Chest Portable 1 View  Result Date: 08/19/2021 CLINICAL DATA:  Port placement. EXAM: PORTABLE CHEST 1 VIEW COMPARISON:  None. FINDINGS: The heart size and mediastinal contours are within normal limits. The lungs are clear without effusions or infiltrates. No pneumothorax is seen. No acute osseous abnormality. A right internal jugular chest port terminates over the right atrium. IMPRESSION: Right chest port terminates over the right atrium.  Electronically Signed   By: Brett Fairy M.D.   On: 08/19/2021 04:55   ECHOCARDIOGRAM COMPLETE  Result Date: 08/19/2021    ECHOCARDIOGRAM REPORT   Patient Name:   Sonya Poole Date of Exam: 08/19/2021 Medical Rec #:  [id number]     Height:       68.0 in Accession #:    [id number]    Weight:       260.0 lb Date of Birth:  October 27, 1950     BSA:          2.285 m Patient Age:    28 years      BP:           118/86 mmHg Patient Gender: F             HR:           100 bpm. Exam Location:  Inpatient Procedure: 2D Echo, Cardiac Doppler, Color Doppler and Intracardiac  Opacification Agent Indications:    TIA  History:        Patient has no prior history of Echocardiogram examinations.                 Risk Factors:Hypertension and Diabetes.  Sonographer:    Clayton Lefort RDCS (AE) Referring Phys: [id number] ERIC CHEN  Sonographer Comments: Technically difficult study due to poor echo windows and patient is morbidly obese. Image acquisition challenging due to patient body habitus. IMPRESSIONS  1. Left ventricular ejection fraction, by estimation, is 60 to 65%. The left ventricle has normal function. The left ventricle has no regional wall motion abnormalities. There is mild left ventricular hypertrophy. Left ventricular diastolic parameters are consistent with Grade I diastolic dysfunction (impaired relaxation).  2. Right ventricular systolic function is normal. The right ventricular size is normal.  3. The mitral valve is normal in structure. No evidence of mitral valve regurgitation.  4. The aortic valve was not well visualized. Aortic valve regurgitation is not visualized. No aortic stenosis is present. FINDINGS  Left Ventricle: Left ventricular ejection fraction, by estimation, is 60 to 65%. The left ventricle has normal function. The left ventricle has no regional wall motion abnormalities. Definity contrast agent was given IV to delineate the left ventricular  endocardial borders. The left ventricular internal cavity  size was normal in size. There is mild left ventricular hypertrophy. Left ventricular diastolic parameters are consistent with Grade I diastolic dysfunction (impaired relaxation). Right Ventricle: The right ventricular size is normal. Right vetricular wall thickness was not well visualized. Right ventricular systolic function is normal. Left Atrium: Left atrial size was normal in size. Right Atrium: Right atrial size was not well visualized. Pericardium: There is no evidence of pericardial effusion. Mitral Valve: The mitral valve is normal in structure. No evidence of mitral valve regurgitation. MV peak gradient, 6.5 mmHg. The mean mitral valve gradient is 3.0 mmHg. Tricuspid Valve: The tricuspid valve is normal in structure. Tricuspid valve regurgitation is trivial. Aortic Valve: The aortic valve was not well visualized. Aortic valve regurgitation is not visualized. No aortic stenosis is present. Aortic valve mean gradient measures 3.0 mmHg. Aortic valve peak gradient measures 5.0 mmHg. Aortic valve area, by VTI measures 1.80 cm. Pulmonic Valve: The pulmonic valve was not well visualized. Pulmonic valve regurgitation is not visualized. Aorta: The aortic root is normal in size and structure. IAS/Shunts: The interatrial septum was not well visualized.  LEFT VENTRICLE PLAX 2D LVIDd:         4.20 cm   Diastology LVIDs:         3.00 cm   LV e' medial:    4.50 cm/s LV PW:         1.10 cm   LV E/e' medial:  17.0 LV IVS:        1.30 cm   LV e' lateral:   5.25 cm/s LVOT diam:     1.90 cm   LV E/e' lateral: 14.6 LV SV:         34 LV SV Index:   15 LVOT Area:     2.84 cm  LEFT ATRIUM             Index LA diam:        4.30 cm 1.88 cm/m LA Vol (A2C):   44.4 ml 19.43 ml/m LA Vol (A4C):   31.6 ml 13.83 ml/m LA Biplane Vol: 36.8 ml 16.11 ml/m  AORTIC VALVE AV Area (Vmax):    1.87 cm AV  Area (Vmean):   1.86 cm AV Area (VTI):     1.80 cm AV Vmax:           112.00 cm/s AV Vmean:          73.800 cm/s AV VTI:            0.187  m AV Peak Grad:      5.0 mmHg AV Mean Grad:      3.0 mmHg LVOT Vmax:         73.90 cm/s LVOT Vmean:        48.400 cm/s LVOT VTI:          0.119 m LVOT/AV VTI ratio: 0.64  AORTA Ao Root diam: 3.30 cm Ao Asc diam:  3.30 cm MITRAL VALVE MV Area (PHT): 2.59 cm     SHUNTS MV Area VTI:   1.59 cm     Systemic VTI:  0.12 m MV Peak grad:  6.5 mmHg     Systemic Diam: 1.90 cm MV Mean grad:  3.0 mmHg MV Vmax:       1.27 m/s MV Vmean:      76.2 cm/s MV Decel Time: 293 msec MV E velocity: 76.50 cm/s MV A velocity: 130.00 cm/s MV E/A ratio:  0.59 Oswaldo Milian MD Electronically signed by Oswaldo Milian MD Signature Date/Time: 08/19/2021/1:48:35 PM    Final    VAS [place] LOWER EXTREMITY VENOUS (DVT)  Result Date: 08/19/2021  Lower Venous DVT Study Patient Name:  Sonya Poole  Date of Exam:   08/19/2021 Medical Rec #: [id number]      Accession #:    [id number] Date of Birth: 03/07/1951      Patient Gender: F Patient Age:   89 years Exam Location:  [facility] Procedure:      VAS [place] LOWER EXTREMITY VENOUS (DVT) Referring Phys: Cornelius Moras XU --------------------------------------------------------------------------------  Indications: Stroke.  Limitations: Body habitus and poor ultrasound/tissue interface. Comparison Study: No prior study Performing Technologist: Maudry Mayhew MHA, RDMS, RVT, RDCS  Examination Guidelines: A complete evaluation includes B-mode imaging, spectral Doppler, color Doppler, and power Doppler as needed of all accessible portions of each vessel. Bilateral testing is considered an integral part of a complete examination. Limited examinations for reoccurring indications may be performed as noted. The reflux portion of the exam is performed with the patient in reverse Trendelenburg.  +---------+---------------+---------+-----------+----------+--------------+  RIGHT     Compressibility Phasicity Spontaneity Properties Thrombus Aging   +---------+---------------+---------+-----------+----------+--------------+  CFV       Full            Yes       Yes                                    +---------+---------------+---------+-----------+----------+--------------+  SFJ       Full                                                             +---------+---------------+---------+-----------+----------+--------------+  FV Prox   Full                                                             +---------+---------------+---------+-----------+----------+--------------+  FV Mid    Full                                                             +---------+---------------+---------+-----------+----------+--------------+  FV Distal Full                                                             +---------+---------------+---------+-----------+----------+--------------+  PFV       Full                                                             +---------+---------------+---------+-----------+----------+--------------+  POP       Full            Yes       Yes                                    +---------+---------------+---------+-----------+----------+--------------+   Right Technical Findings: Not visualized segments include PTV, peroneal veins.  +---------+---------------+---------+-----------+----------+--------------+  LEFT      Compressibility Phasicity Spontaneity Properties Thrombus Aging  +---------+---------------+---------+-----------+----------+--------------+  CFV       Full            Yes       Yes                                    +---------+---------------+---------+-----------+----------+--------------+  SFJ       Full                                                             +---------+---------------+---------+-----------+----------+--------------+  FV Prox   Full                                                             +---------+---------------+---------+-----------+----------+--------------+  FV Mid    Full                                                              +---------+---------------+---------+-----------+----------+--------------+  FV Distal Full                                                             +---------+---------------+---------+-----------+----------+--------------+  PFV       Full                                                             +---------+---------------+---------+-----------+----------+--------------+  POP       Full            Yes       Yes                                    +---------+---------------+---------+-----------+----------+--------------+   Left Technical Findings: Not visualized segments include PTV, peroneal veins.   Summary: RIGHT: - There is no evidence of deep vein thrombosis in the lower extremity. However, portions of this examination were limited- see technologist comments above.  - No cystic structure found in the popliteal fossa.  LEFT: - There is no evidence of deep vein thrombosis in the lower extremity. However, portions of this examination were limited- see technologist comments above.  - No cystic structure found in the popliteal fossa.  *See table(s) above for measurements and observations. Electronically signed by Deitra Mayo MD on 08/19/2021 at 3:20:12 PM.    Final     12-lead ECG on arrival shows sinus tach at 116 bpm (personally reviewed) All prior EKG's in EPIC reviewed with no documented atrial fibrillation  Telemetry NSR (personally reviewed)  Assessment and Plan:  1. Cryptogenic stroke The patient presents with cryptogenic stroke.  The patient does have a TEE planned for this AM.  I spoke at length with the patient about monitoring for afib with an implantable loop recorder.  Risks, benefits, and alteratives to implantable loop recorder were discussed with the patient today.   At this time, the patient is very clear in their decision to proceed with implantable loop recorder.   Wound care was reviewed with the patient (keep incision clean and dry for 3  days).  Wound check scheduled and entered in AVS. Please call with questions.    Shirley Friar, PA-C 08/21/2021 9:32 AM

## 2021-08-21 NOTE — TOC Transition Note (Signed)
Transition of Care [facility]) - CM/SW Discharge Note   Patient Details  Name: Sonya Poole MRN: [id number] Date of Birth: 08/25/1950  Transition of Care [facility]) CM/SW Contact:  Pollie Friar, RN Phone Number: 08/21/2021, 10:45 AM   Clinical Narrative:    Patient to discharge home later today. CM has verified receipt of 3 in 1.  [place] services arranged through [facility] with information on the AVS. PCP: Dr Franki Cabot [place] Pt has needed supervision at home and transportation to home once discharged.    Final next level of care: [place] Barriers to Discharge: No Barriers Identified   Patient Goals and CMS Choice Patient states their goals for this hospitalization and ongoing recovery are:: to go home CMS Medicare.gov Compare Post Acute Care list provided to:: Patient Choice offered to / list presented to : Patient  Discharge Placement                       Discharge Plan and Services   Discharge Planning Services: CM Consult Post Acute Care Choice: Home Health                    HH Arranged: PT, OT, Speech Therapy HH Agency: [place] Date [place]: 08/20/21 Time [place]: 1343 Representative spoke with at [place]: Amy  Social Determinants of Health ([place]) Interventions     Readmission Risk Interventions No flowsheet data found.

## 2021-08-21 NOTE — Discharge Instructions (Signed)

## 2021-08-21 NOTE — Progress Notes (Signed)
°  Echocardiogram Echocardiogram Transesophageal has been performed.  Sonya Poole M 08/21/2021, 2:06 PM

## 2021-08-22 ENCOUNTER — Encounter (HOSPITAL_COMMUNITY): Payer: Self-pay | Admitting: Cardiology

## 2021-08-22 MED FILL — Lidocaine Inj 1% w/ Epinephrine-1:100000: INTRAMUSCULAR | Qty: 10 | Status: AC

## 2021-08-22 NOTE — Anesthesia Postprocedure Evaluation (Signed)
Anesthesia Post Note  Patient: Sonya Poole  Procedure(s) Performed: TRANSESOPHAGEAL ECHOCARDIOGRAM (TEE) BUBBLE STUDY     Patient location during evaluation: PACU Anesthesia Type: MAC Level of consciousness: awake and alert Pain management: pain level controlled Vital Signs Assessment: post-procedure vital signs reviewed and stable Respiratory status: spontaneous breathing, nonlabored ventilation, respiratory function stable and patient connected to nasal cannula oxygen Cardiovascular status: blood pressure returned to baseline and stable Postop Assessment: no apparent nausea or vomiting Anesthetic complications: no   No notable events documented.  Last Vitals:  Vitals:   08/21/21 1440 08/21/21 1716  BP: (!) 132/91 (!) 160/88  Pulse: (!) 105 (!) 107  Resp: 20 20  Temp:  36.6 C  SpO2: 94% 100%    Last Pain:  Vitals:   08/21/21 1716  TempSrc: Oral  PainSc:                  Nykayla Marcelli L Junelle Hashemi

## 2021-08-31 ENCOUNTER — Ambulatory Visit (INDEPENDENT_AMBULATORY_CARE_PROVIDER_SITE_OTHER): Payer: 59 | Admitting: Student

## 2021-08-31 ENCOUNTER — Other Ambulatory Visit: Payer: Self-pay

## 2021-08-31 DIAGNOSIS — G459 Transient cerebral ischemic attack, unspecified: Secondary | ICD-10-CM

## 2021-08-31 LAB — CUP PACEART INCLINIC DEVICE CHECK
Date Time Interrogation Session: 20230202105222
Implantable Pulse Generator Implant Date: 20230123

## 2021-08-31 NOTE — Progress Notes (Signed)
ILR wound check virtually. Steri strips previously removed. Wound well healed. Home monitor transmitting nightly. No episodes. Questions answered.

## 2021-09-06 ENCOUNTER — Other Ambulatory Visit: Payer: Self-pay | Admitting: *Deleted

## 2021-09-06 ENCOUNTER — Other Ambulatory Visit: Payer: Self-pay

## 2021-09-06 NOTE — Patient Outreach (Signed)
[place] [facility]) Care Management  09/06/2021  Addalynne Golding [birth year] [id number]   RED ON EMMI ALERT - Stroke Day # 13 Date: 2/7 Red Alert Reason: Martin Majestic to follow up appointment?  NO   Outreach attempt #1, unsuccessful, unable to leave voice message as mailbox not set up.   Plan: RN CM will send outreach letter and follow up within the next 3-4 business days.  Valente David, RN, MSN, [place] Manager [phone]

## 2021-09-06 NOTE — Patient Outreach (Addendum)
Received a red flag Emmi stroke notification for Sonya Poole.  I have assigned Valente David, RN to call for follow up and determine if there are any Case Management needs.     [place] [facility], [place], [place] Management [phone]

## 2021-09-11 ENCOUNTER — Other Ambulatory Visit: Payer: Self-pay | Admitting: *Deleted

## 2021-09-11 NOTE — Patient Outreach (Signed)
[name]  09/11/2021  Sonya Poole 1951/07/14 [id number]   RED ON EMMI ALERT - Stroke Day # 13 Date: 2/7 Red Alert Reason: Martin Majestic to follow up appointment?  NO     Outreach attempt #2, unsuccessful, unable to leave voice message as mailbox not set up.     Plan: RN CM will follow up within the next 3-4 business days.  Valente David, RN, MSN, [place] Manager [phone]

## 2021-09-14 ENCOUNTER — Other Ambulatory Visit: Payer: Self-pay | Admitting: *Deleted

## 2021-09-14 NOTE — Patient Outreach (Signed)
[place] [facility]) Care Management  09/14/2021  Sonya Poole Jul 20, 1951 [id number]   RED ON EMMI ALERT - Stroke Day # 13 Date: 2/7 Red Alert Reason: Martin Majestic to follow up appointment?  NO     Outreach attempt #3, unsuccessful, unable to leave voice message as mailbox not set up.     Plan: RN CM will make 4th and final attempt within the next 4 weeks, if remain unsuccessful will close case due to inability to maintain contact.  Valente David, RN, MSN, [place] Manager [phone]

## 2021-09-25 ENCOUNTER — Ambulatory Visit (INDEPENDENT_AMBULATORY_CARE_PROVIDER_SITE_OTHER): Payer: 59

## 2021-09-25 DIAGNOSIS — G459 Transient cerebral ischemic attack, unspecified: Secondary | ICD-10-CM

## 2021-09-26 LAB — CUP PACEART REMOTE DEVICE CHECK
Date Time Interrogation Session: 20230227191947
Implantable Pulse Generator Implant Date: 20230123

## 2021-10-02 NOTE — Progress Notes (Signed)
Carelink Summary Report / Loop Recorder 

## 2021-10-03 ENCOUNTER — Inpatient Hospital Stay: Payer: 59 | Admitting: Adult Health

## 2021-10-12 ENCOUNTER — Other Ambulatory Visit: Payer: Self-pay | Admitting: *Deleted

## 2021-10-12 NOTE — Patient Outreach (Addendum)
[place] [facility]) Care Management ? ?10/12/2021 ? ?Nicholes Stairs ?October 09, 1950 ?[id number] ? ? ?RED ON EMMI ALERT - Stroke ?Day # 13 ?Date: 2/7 ?Red Alert Reason: Martin Majestic to follow up appointment?  NO ? ? ?Outreach attempt #4, successful.  Identity verified.  This care manager introduced self and stated purpose of call.  [facility] care management services explained.   ? ?Member report she has improved since discharge.  She is currently active with home health for PT, OT, and ST.  State her speech is much clearer then the past several weeks.  She lives with her husband who is helping with management of health care as needed.  She has not been to follow up appointment as it is scheduled in the future (4/18 with neurology, also has PCP visit next month at [facility]).  She does not need transportation assistance, husband will take her.  Denies any questions regarding medications, report taking as instructed.  Denies any urgent concerns, encouraged to contact this care manager with questions.   ? ?Plan: ?RN CM will send education regarding stroke recovery and prevention.  Will close case at this time as no further needs identified. ? ?Valente David, RN, MSN, CCM ?[facility] Care Management  ?Community Care Manager ?[phone] ? ?

## 2021-10-30 ENCOUNTER — Ambulatory Visit (INDEPENDENT_AMBULATORY_CARE_PROVIDER_SITE_OTHER): Payer: Medicare Other

## 2021-10-30 DIAGNOSIS — G459 Transient cerebral ischemic attack, unspecified: Secondary | ICD-10-CM | POA: Diagnosis not present

## 2021-10-30 LAB — CUP PACEART REMOTE DEVICE CHECK
Date Time Interrogation Session: 20230401192407
Implantable Pulse Generator Implant Date: 20230123

## 2021-11-13 NOTE — Progress Notes (Signed)
Carelink Summary Report / Loop Recorder 

## 2021-11-14 ENCOUNTER — Encounter: Payer: Self-pay | Admitting: Adult Health

## 2021-11-14 ENCOUNTER — Inpatient Hospital Stay: Payer: Medicaid Other | Admitting: Adult Health

## 2021-11-14 NOTE — Progress Notes (Deleted)
[facility] [address] [place]. [place] [address] [phone]       HOSPITAL FOLLOW UP NOTE  [name] Date of Birth:  18-Sep-1950 Medical Record Number:  [id number]   Reason for Referral:  hospital stroke follow up    SUBJECTIVE:   CHIEF COMPLAINT:  No chief complaint on file.   HPI:   [name] is a 71 y.o. 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  Personally reviewed hospitalization pertinent progress notes, lab work and imaging.  Evaluated by Dr. 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  MRA head/neck unremarkable.  LE Doppler negative.  EF 60 to 65%.  TEE possible small PFO.  Loop recorder placed to evaluate for possible A-fib.  LDL 47.  A1c 9.9.  On aspirin PTA, recommended DAPT for 3 weeks and Plavix alone and continuation of pravastatin 20 mg daily.  Prior stroke 08/2020 admitted to [facility] with MRI showing right parietal pole central gyrus infarct, etiology thought to be related to MM med pomalidomide therefore discontinued and changed to cyclophosphamide, had f/u cardiology visit with Zio patch showing 40 brief episode of SVT, as asymptomatic no treatment offered.  Therapy eval's recommended home health therapies.         PERTINENT IMAGING  Per hospitalization 08/18/2021 CT head no acute abnormalities MRI  Right MCA scattered infarcts MRA head and neck unremarkable LE venous doppler no DVT 2D Echo EF 60 to 65% TEE possible small PFO  Loop recorder placed LDL 47 HgbA1c 9.9    ROS:   14 system review of systems performed and negative with exception of ***  PMH:  Past Medical History:  Diagnosis Date   Asthma    Cancer ([place])    Diabetes mellitus without complication ([place])    Hypertension    Immune deficiency disorder ([place])    Peripheral neuropathy     Stroke ([place])     PSH:  Past Surgical History:  Procedure Laterality Date   BUBBLE STUDY  08/21/2021   Procedure: BUBBLE STUDY;  Surgeon: Jerline Pain, MD;  Location: [place];  Service: Cardiovascular;;   CHOLECYSTECTOMY     intestine remove     LOOP RECORDER INSERTION N/A 08/21/2021   Procedure: LOOP RECORDER INSERTION;  Surgeon: Vickie Epley, MD;  Location: [place] CV LAB;  Service: Cardiovascular;  Laterality: N/A;   TEE WITHOUT CARDIOVERSION N/A 08/21/2021   Procedure: TRANSESOPHAGEAL ECHOCARDIOGRAM (TEE);  Surgeon: Jerline Pain, MD;  Location: [facility] ENDOSCOPY;  Service: Cardiovascular;  Laterality: N/A;    Social History:  Social History   Socioeconomic History   Marital status: Married    Spouse name: Not on file   Number of children: Not on file   Years of education: Not on file   Highest education level: Not on file  Occupational History   Not on file  Tobacco Use   Smoking status: Never   Smokeless tobacco: Never  Substance and Sexual Activity   Alcohol use: Yes    Alcohol/week: 1.0 standard drink    Types: 1 Glasses of wine per week   Drug use: Never   Sexual activity: Yes    Birth control/protection: None  Other Topics Concern   Not on file  Social History Narrative   Not on file   Social Determinants of Health  Financial Resource Strain: Not on file  Food Insecurity: Not on file  Transportation Needs: Not on file  Physical Activity: Not on file  Stress: Not on file  Social Connections: Not on file  Intimate Partner Violence: Not on file    Family History: No family history on file.  Medications:   Current Outpatient Medications on File Prior to Visit  Medication Sig Dispense Refill   acyclovir (ZOVIRAX) 400 MG tablet Take 400 mg by mouth 2 (two) times daily.     amLODipine (NORVASC) 10 MG tablet Take 10 mg by mouth daily.     clopidogrel (PLAVIX) 75 MG tablet Take 1 tablet (75 mg total) by mouth daily. 563 tablet 0   folic acid  (FOLVITE) 1 MG tablet Take 1 tablet by mouth daily.     gabapentin (NEURONTIN) 300 MG capsule Take 900 mg by mouth in the morning and at bedtime.     Insulin Glargine-Lixisenatide 100-33 UNT-MCG/ML SOPN Inject 38 Units into the skin daily as needed. If blood is 250 or higher     lidocaine-prilocaine (EMLA) cream Apply 1 application topically as needed (port site access every other wednesday).     metFORMIN (GLUCOPHAGE-XR) 500 MG 24 hr tablet Take 500 mg by mouth 2 (two) times daily.     nortriptyline (PAMELOR) 10 MG capsule Take 10 mg by mouth at bedtime.     omeprazole (PRILOSEC) 20 MG capsule Take 20 mg by mouth daily.     oxyCODONE (OXY IR/ROXICODONE) 5 MG immediate release tablet Take 5 mg by mouth daily as needed for moderate pain.     potassium chloride (MICRO-K) 10 MEQ CR capsule Take 10 mEq by mouth 2 (two) times daily.     pravastatin (PRAVACHOL) 20 MG tablet Take 1 tablet (20 mg total) by mouth daily. 90 tablet 0   No current facility-administered medications on file prior to visit.    Allergies:   Allergies  Allergen Reactions   Lisinopril Swelling      OBJECTIVE:  Physical Exam  There were no vitals filed for this visit. There is no height or weight on file to calculate BMI. No results found.      View : No data to display.           General: well developed, well nourished, seated, in no evident distress Head: head normocephalic and atraumatic.   Neck: supple with no carotid or supraclavicular bruits Cardiovascular: regular rate and rhythm, no murmurs Musculoskeletal: no deformity Skin:  no rash/petichiae Vascular:  Normal pulses all extremities   Neurologic Exam Mental Status: Awake and fully alert. Oriented to place and time. Recent and remote memory intact. Attention span, concentration and fund of knowledge appropriate. Mood and affect appropriate.  Cranial Nerves: Fundoscopic exam reveals sharp disc margins. Pupils equal, briskly reactive to light.  Extraocular movements full without nystagmus. Visual fields full to confrontation. Hearing intact. Facial sensation intact. Face, tongue, palate moves normally and symmetrically.  Motor: Normal bulk and tone. Normal strength in all tested extremity muscles Sensory.: intact to touch , pinprick , position and vibratory sensation.  Coordination: Rapid alternating movements normal in all extremities. Finger-to-nose and heel-to-shin performed accurately bilaterally. Gait and Station: Arises from chair without difficulty. Stance is normal. Gait demonstrates normal stride length and balance with ***. Tandem walk and heel toe ***.  Reflexes: 1+ and symmetric. Toes downgoing.     NIHSS  *** Modified [place]  ***      ASSESSMENT: Hayla Hinger is a  71 y.o. 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 Vascular risk factors include prior R parietal postcentral gyrus infarct 08/2020 with residual left-sided deficits, etiology felt at that time to be due to MM meds, SVT, HTN, HLD, DM, advanced age and obesity.      PLAN:  Cryptogenic stroke:  Residual deficit: ***.  Loop recorder has not shown atrial fibrillation thus far -routinely monitored by cardiology Continue clopidogrel 75 mg daily  and pravastatin 20 mg daily for secondary stroke prevention.   Discussed secondary stroke prevention measures and importance of close PCP follow up for aggressive stroke risk factor management including BP goal<130/90, HLD with LDL goal<70 and DM with A1c.<7 .  Stroke labs 07/2021: LDL 47, A1c 9.9 I have gone over the pathophysiology of stroke, warning signs and symptoms, risk factors and their management in some detail with instructions to go to the closest emergency room for symptoms of concern. HTN: BP goal <130/90.  Stable on *** per PCP HLD: LDL goal <70. Recent LDL ***.  DMII: A1c goal<7.0. Recent A1c ***.     Follow up in *** or  call earlier if needed   CC:  [facility] provider: Dr. Leonie Man PCP: System, Provider Not In    I spent *** minutes of face-to-face and non-face-to-face time with patient.  This included previsit chart review including review of recent hospitalization, lab review, study review, order entry, electronic health record documentation, patient education regarding recent stroke including etiology, secondary stroke prevention measures and importance of managing stroke risk factors, residual deficits and typical recovery time and answered all other questions to patient satisfaction   Frann Rider, AGNP-BC  [facility] [address] [place], [place] [address]  Phone [phone] Fax [phone] Note: This document was prepared with digital dictation and possible smart phrase technology. Any transcriptional errors that result from this process are unintentional.

## 2022-02-26 NOTE — Progress Notes (Deleted)
[facility] [address] [place]. [place] [address] [phone]       HOSPITAL FOLLOW UP NOTE  [name] Date of Birth:  09-Jan-1951 Medical Record Number:  [id number]   Reason for Referral:  hospital stroke follow up    SUBJECTIVE:   CHIEF COMPLAINT:  No chief complaint on file.   HPI:   [name] is a 71 y.o. 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  Personally reviewed hospitalization pertinent progress notes, lab work and imaging.  Evaluated by Dr. 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  MRA head/neck unremarkable.  LE Doppler negative for DVT.  EF 60 to 65%.  TEE possible small PFO.  Loop recorder placed.  LDL 47.  A1c 9.9.  On full dose aspirin PTA, recommended DAPT for 3 weeks and Plavix alone and continuation of home dose pravastatin 20 mg daily.  History of stroke 08/2020 right parietal/gyrus infarct etiology MM meds of pomalidomide, this was d/c'd by her oncologist and changed to cyclophosphamide, but she has remained on as well as daratumumab and dexamethasone, per pt, MM stable. F/u with cardiology at that time showed SVT on Zio patch although asymptomatic therefore no treatment indicated.  Per therapy evaluations, discharged with home health PT/OT ***.         PERTINENT IMAGING  Per hospitalization 08/18/2021 CT head no acute abnormalities MRI  Right MCA scattered infarcts MRA head and neck unremarkable LE venous doppler no DVT 2D Echo EF 60 to 65% TEE possible small PFO  Loop recorder placed LDL 47 HgbA1c 9.9    ROS:   14 system review of systems performed and negative with exception of ***  PMH:  Past Medical History:  Diagnosis Date   Asthma    Cancer ([place])    Diabetes mellitus without  complication ([place])    Hypertension    Immune deficiency disorder ([place])    Peripheral neuropathy    Stroke ([place])     PSH:  Past Surgical History:  Procedure Laterality Date   BUBBLE STUDY  08/21/2021   Procedure: BUBBLE STUDY;  Surgeon: Jerline Pain, MD;  Location: [place];  Service: Cardiovascular;;   CHOLECYSTECTOMY     intestine remove     LOOP RECORDER INSERTION N/A 08/21/2021   Procedure: LOOP RECORDER INSERTION;  Surgeon: Vickie Epley, MD;  Location: [place] CV LAB;  Service: Cardiovascular;  Laterality: N/A;   TEE WITHOUT CARDIOVERSION N/A 08/21/2021   Procedure: TRANSESOPHAGEAL ECHOCARDIOGRAM (TEE);  Surgeon: Jerline Pain, MD;  Location[facility]OSCOPY;  Service: Cardiovascular;  Laterality: N/A;    Social History:  Social History   Socioeconomic History   Marital status: Married    Spouse name: Not on file   Number of children: Not on file   Years of education: Not on file   Highest education level: Not on file  Occupational History   Not on file  Tobacco Use   Smoking status: Never   Smokeless tobacco: Never  Substance and Sexual Activity   Alcohol use: Yes    Alcohol/week: 1.0 standard drink of alcohol    Types: 1 Glasses of wine per week   Drug use: Never   Sexual activity: Yes    Birth control/protection: None  Other Topics Concern   Not  on file  Social History Narrative   Not on file   Social Determinants of Health   Financial Resource Strain: Not on file  Food Insecurity: Not on file  Transportation Needs: Not on file  Physical Activity: Not on file  Stress: Not on file  Social Connections: Not on file  Intimate Partner Violence: Not on file    Family History: No family history on file.  Medications:   Current Outpatient Medications on File Prior to Visit  Medication Sig Dispense Refill   acyclovir (ZOVIRAX) 400 MG tablet Take 400 mg by mouth 2 (two) times daily.     amLODipine (NORVASC) 10 MG tablet Take 10 mg by mouth daily.      clopidogrel (PLAVIX) 75 MG tablet Take 1 tablet (75 mg total) by mouth daily. 300 tablet 0   gabapentin (NEURONTIN) 300 MG capsule Take 900 mg by mouth in the morning and at bedtime.     Insulin Glargine-Lixisenatide 100-33 UNT-MCG/ML SOPN Inject 38 Units into the skin daily as needed. If blood is 250 or higher     lidocaine-prilocaine (EMLA) cream Apply 1 application topically as needed (port site access every other wednesday).     metFORMIN (GLUCOPHAGE-XR) 500 MG 24 hr tablet Take 500 mg by mouth 2 (two) times daily.     nortriptyline (PAMELOR) 10 MG capsule Take 10 mg by mouth at bedtime.     omeprazole (PRILOSEC) 20 MG capsule Take 20 mg by mouth daily.     oxyCODONE (OXY IR/ROXICODONE) 5 MG immediate release tablet Take 5 mg by mouth daily as needed for moderate pain.     potassium chloride (MICRO-K) 10 MEQ CR capsule Take 10 mEq by mouth 2 (two) times daily.     pravastatin (PRAVACHOL) 20 MG tablet Take 1 tablet (20 mg total) by mouth daily. 90 tablet 0   No current facility-administered medications on file prior to visit.    Allergies:   Allergies  Allergen Reactions   Lisinopril Swelling      OBJECTIVE:  Physical Exam  There were no vitals filed for this visit. There is no height or weight on file to calculate BMI. No results found.      No data to display           General: well developed, well nourished, seated, in no evident distress Head: head normocephalic and atraumatic.   Neck: supple with no carotid or supraclavicular bruits Cardiovascular: regular rate and rhythm, no murmurs Musculoskeletal: no deformity Skin:  no rash/petichiae Vascular:  Normal pulses all extremities   Neurologic Exam Mental Status: Awake and fully alert. Oriented to place and time. Recent and remote memory intact. Attention span, concentration and fund of knowledge appropriate. Mood and affect appropriate.  Cranial Nerves: Fundoscopic exam reveals sharp disc margins. Pupils  equal, briskly reactive to light. Extraocular movements full without nystagmus. Visual fields full to confrontation. Hearing intact. Facial sensation intact. Face, tongue, palate moves normally and symmetrically.  Motor: Normal bulk and tone. Normal strength in all tested extremity muscles Sensory.: intact to touch , pinprick , position and vibratory sensation.  Coordination: Rapid alternating movements normal in all extremities. Finger-to-nose and heel-to-shin performed accurately bilaterally. Gait and Station: Arises from chair without difficulty. Stance is normal. Gait demonstrates normal stride length and balance with ***. Tandem walk and heel toe ***.  Reflexes: 1+ and symmetric. Toes downgoing.     NIHSS  *** Modified [place]  ***      ASSESSMENT: Sonya Poole is  a 71 y.o. year old female with right MCA scattered infarcts on 6/96/2952 likely embolic secondary to unclear source s/p ILR.  History of right parietal placental gyrus infarct 08/2020 felt to be due to MM medications at that time. Vascular risk factors include HTN, HLD, DM, asymptomatic SVT, possible small PFO on TEE and MM.      PLAN:  R MCA strokes:  Hx of R parietal stroke Residual deficit: Left hemiparesis ***.  Loop recorder has not shown atrial fibrillation thus far but last scheduled download 10/2021, next scheduled download 02/2022, she was encouraged to contact cardiology office [phone]) to further discuss Continue clopidogrel 75 mg daily  and pravastatin 20 mg daily for secondary stroke prevention.   Discussed secondary stroke prevention measures and importance of close PCP follow up for aggressive stroke risk factor management including BP goal<130/90, HLD with LDL goal<70 and DM with A1c.<7 .  Stroke labs 07/2021: LDL 47, A1c  9.9 I have gone over the pathophysiology of stroke, warning signs and symptoms, risk factors and their management in some detail with instructions to go to the closest emergency room  for symptoms of concern. Possible small PFO: ROPE score 3 indicating 0% chance stroke in setting of PFO.  No indication for intervention/closure.  Continue medical management    Follow up in *** or call earlier if needed   CC:  [facility] provider: Dr. Leonie Man PCP: System, Provider Not In    I spent *** minutes of face-to-face and non-face-to-face time with patient.  This included previsit chart review including review of recent hospitalization, lab review, study review, order entry, electronic health record documentation, patient education regarding recent stroke including etiology, secondary stroke prevention measures and importance of managing stroke risk factors, residual deficits and typical recovery time and answered all other questions to patient satisfaction   Frann Rider, AGNP-BC  [facility] [address] [place], [place] [address]  Phone [phone] Fax [phone] Note: This document was prepared with digital dictation and possible smart phrase technology. Any transcriptional errors that result from this process are unintentional.

## 2022-02-27 ENCOUNTER — Telehealth: Payer: Self-pay | Admitting: Adult Health

## 2022-02-27 ENCOUNTER — Inpatient Hospital Stay: Payer: Medicaid Other | Admitting: Adult Health

## 2022-02-27 NOTE — Telephone Encounter (Signed)
Pt husband called and re-schedule appointment. Pt not feeling well and also had another appointment.

## 2022-03-23 ENCOUNTER — Telehealth: Payer: Self-pay

## 2022-03-23 NOTE — Telephone Encounter (Signed)
I spoke with the patient husband and ordered her a new monitor. She should receive it in 7-10 business days. I ordered her a return label for the old monitor.

## 2022-03-29 ENCOUNTER — Telehealth: Payer: Self-pay | Admitting: Student

## 2022-03-29 NOTE — Telephone Encounter (Signed)
Spoke with patient husband and assured him everything is okay with the monitor and he can cover it with a towel if the lights are bothersome. Patient husband verbalized understanding

## 2022-03-29 NOTE — Telephone Encounter (Signed)
Pt's husband is calling to get advice on wife device. He says it was changing colors and only just now went back to a green light. Requesting call back.

## 2022-04-03 ENCOUNTER — Telehealth: Payer: Self-pay

## 2022-04-03 NOTE — Telephone Encounter (Signed)
Pt was implanted with loop recorder 08/21/2021 for cryptogenic stroke.  It appears Pt has not been able to send a remote transmission since 10/28/2021.  New monitor ordered 03/23/2022.  This is first transmission received since connectivity reeastablished.  Forwarding to implanting doctor for review.  Alert. 11/06/2021: 8 hours 30 min of AF detected. Not previously reported. Not on [place]. Sent to Triage, high Priority.  7 false detected pauses, undersensing noted. 1 SVT event 16 seconds duration.

## 2022-04-03 NOTE — Telephone Encounter (Signed)
Outreach attempted to Pt's home phone number and cell number.  Home phone number did not have a voicemail.  Cell phone number-unable to leave a message because mailbox was full.  No DPR on file.

## 2022-04-05 NOTE — Telephone Encounter (Signed)
Spoke to patient, advised Dr. Quentin Ore recommends a referral to AF clinic to discuss [place] d/t AF noted on ILR. Patient is agreeable. Advised I will forward over to clinic and someone will call to set up apt. Patient appreciative of call.

## 2022-04-05 NOTE — Telephone Encounter (Signed)
Called both phones and wasn't able to leave a vm on her cell phone and the house phone it just kept ringing.

## 2022-04-09 ENCOUNTER — Encounter (HOSPITAL_COMMUNITY): Payer: Self-pay | Admitting: *Deleted

## 2022-04-10 NOTE — Telephone Encounter (Signed)
Letter sent asking pt to contact our office unable to reach by phone.

## 2022-04-13 NOTE — Telephone Encounter (Signed)
Attempted to contact pt again unable to leave message. Voicemail full. Will attempt again next week.

## 2022-04-20 NOTE — Telephone Encounter (Signed)
Called and still wasn't able to leave a message mailbox is full.

## 2022-04-23 ENCOUNTER — Ambulatory Visit (INDEPENDENT_AMBULATORY_CARE_PROVIDER_SITE_OTHER): Payer: Medicare Other

## 2022-04-23 DIAGNOSIS — G459 Transient cerebral ischemic attack, unspecified: Secondary | ICD-10-CM | POA: Diagnosis not present

## 2022-04-24 LAB — CUP PACEART REMOTE DEVICE CHECK
Date Time Interrogation Session: 20230924231331
Implantable Pulse Generator Implant Date: 20230123

## 2022-05-04 NOTE — Progress Notes (Signed)
Carelink Summary Report / Loop Recorder 

## 2022-05-23 ENCOUNTER — Ambulatory Visit (HOSPITAL_COMMUNITY): Payer: Medicare Other | Admitting: Nurse Practitioner

## 2022-05-28 ENCOUNTER — Ambulatory Visit (INDEPENDENT_AMBULATORY_CARE_PROVIDER_SITE_OTHER): Payer: Medicare Other

## 2022-05-28 DIAGNOSIS — G459 Transient cerebral ischemic attack, unspecified: Secondary | ICD-10-CM | POA: Diagnosis not present

## 2022-05-29 ENCOUNTER — Ambulatory Visit (HOSPITAL_COMMUNITY): Payer: Medicare Other | Admitting: Nurse Practitioner

## 2022-05-29 LAB — CUP PACEART REMOTE DEVICE CHECK
Date Time Interrogation Session: 20231027230350
Implantable Pulse Generator Implant Date: 20230123

## 2022-06-05 ENCOUNTER — Ambulatory Visit (HOSPITAL_COMMUNITY): Payer: Medicare Other | Admitting: Physician Assistant

## 2022-06-07 NOTE — Progress Notes (Deleted)
[facility] [address] [place]. [place] [address] [phone]       HOSPITAL FOLLOW UP NOTE  [name] Date of Birth:  Feb 17, 1951 Medical Record Number:  [id number]   Reason for Referral:  hospital stroke follow up    SUBJECTIVE:   CHIEF COMPLAINT:  No chief complaint on file.   HPI:   [name] is a 71 y.o. female with history of HTN, HLD, DM, stroke in 08/2020, MM on chemo who presented on 08/18/2021 with slurry speech, left facial droop and left sided weakness more than normal baseline.  Personally reviewed hospitalization pertinent progress notes, lab work and imaging.  Evaluated by Dr. 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 MRA head/neck unremarkable.  LE Doppler negative for DVT.  EF 60 to 65%.  TEE possible small PFO.  Loop recorder placed.  LDL 47.  A1c 9.9.  Recommended DAPT for 3 weeks then Plavix alone as well as continuation of pravastatin 20 mg daily.  Prior history of stroke 08/2020 admitted to [facility] with right parietal potential gyrus infarct felt to be due to MM meds of Pomalidomide, d/c'd by oncologist and changed to cyclophosphamide.  Completed Zio patch monitor and found to have SVT, followed by cardiology at [facility], no treatment indicated as asymptomatic.  Other stroke risk factors include advanced age and obesity.  Other active problems include baseline left-sided weakness due to neuropathy.  Evaluated by therapies recommended home health PT/OT.        PERTINENT IMAGING  Per hospitalization 08/18/2021 - *** CT head no acute abnormalities MRI  Right MCA scattered infarcts MRA head and neck unremarkable LE venous doppler no DVT 2D Echo EF 60 to 65% TEE possible small PFO  Loop recorder placed LDL 47 HgbA1c 9.9    ROS:   14 system review of systems performed and negative with exception of ***  PMH:  Past Medical History:  Diagnosis Date   Asthma    Cancer ([place])    Diabetes  mellitus without complication ([place])    Hypertension    Immune deficiency disorder ([place])    Peripheral neuropathy    Stroke ([place])     PSH:  Past Surgical History:  Procedure Laterality Date   BUBBLE STUDY  08/21/2021   Procedure: BUBBLE STUDY;  Surgeon: Jerline Pain, MD;  Location: [facility];  Service: Cardiovascular;;   CHOLECYSTECTOMY     intestine remove     LOOP RECORDER INSERTION N/A 08/21/2021   Procedure: LOOP RECORDER INSERTION;  Surgeon: Vickie Epley, MD;  Location: [facility];  Service: Cardiovascular;  Laterality: N/A;   TEE WITHOUT CARDIOVERSION N/A 08/21/2021   Procedure: TRANSESOPHAGEAL ECHOCARDIOGRAM (TEE);  Surgeon: Jerline Pain, MD;  Location: [facility] ENDOSCOPY;  Service: Cardiovascular;  Laterality: N/A;    Social History:  Social History   Socioeconomic History   Marital status: Married    Spouse name: Not on file   Number of children: Not on file   Years of education: Not on file   Highest education level: Not on file  Occupational History   Not on file  Tobacco Use   Smoking status: Never   Smokeless tobacco: Never  Substance and Sexual Activity   Alcohol use: Yes    Alcohol/week: 1.0 standard drink of alcohol    Types: 1 Glasses of wine per week   Drug use: Never   Sexual activity: Yes    Birth control/protection: None  Other Topics  Concern   Not on file  Social History Narrative   Not on file   Social Determinants of Health   Financial Resource Strain: Not on file  Food Insecurity: Not on file  Transportation Needs: Not on file  Physical Activity: Not on file  Stress: Not on file  Social Connections: Not on file  Intimate Partner Violence: Not on file    Family History: No family history on file.  Medications:   Current Outpatient Medications on File Prior to Visit  Medication Sig Dispense Refill   acyclovir (ZOVIRAX) 400 MG tablet Take 400 mg by mouth 2 (two) times daily.     amLODipine (NORVASC) 10 MG tablet Take 10  mg by mouth daily.     clopidogrel (PLAVIX) 75 MG tablet Take 1 tablet (75 mg total) by mouth daily. 300 tablet 0   gabapentin (NEURONTIN) 300 MG capsule Take 900 mg by mouth in the morning and at bedtime.     Insulin Glargine-Lixisenatide 100-33 UNT-MCG/ML SOPN Inject 38 Units into the skin daily as needed. If blood is 250 or higher     lidocaine-prilocaine (EMLA) cream Apply 1 application topically as needed (port site access every other wednesday).     metFORMIN (GLUCOPHAGE-XR) 500 MG 24 hr tablet Take 500 mg by mouth 2 (two) times daily.     nortriptyline (PAMELOR) 10 MG capsule Take 10 mg by mouth at bedtime.     omeprazole (PRILOSEC) 20 MG capsule Take 20 mg by mouth daily.     oxyCODONE (OXY IR/ROXICODONE) 5 MG immediate release tablet Take 5 mg by mouth daily as needed for moderate pain.     potassium chloride (MICRO-K) 10 MEQ CR capsule Take 10 mEq by mouth 2 (two) times daily.     pravastatin (PRAVACHOL) 20 MG tablet Take 1 tablet (20 mg total) by mouth daily. 90 tablet 0   No current facility-administered medications on file prior to visit.    Allergies:   Allergies  Allergen Reactions   Lisinopril Swelling      OBJECTIVE:  Physical Exam  There were no vitals filed for this visit. There is no height or weight on file to calculate BMI. No results found.      No data to display           General: well developed, well nourished, seated, in no evident distress Head: head normocephalic and atraumatic.   Neck: supple with no carotid or supraclavicular bruits Cardiovascular: regular rate and rhythm, no murmurs Musculoskeletal: no deformity Skin:  no rash/petichiae Vascular:  Normal pulses all extremities   Neurologic Exam Mental Status: Awake and fully alert. Oriented to place and time. Recent and remote memory intact. Attention span, concentration and fund of knowledge appropriate. Mood and affect appropriate.  Cranial Nerves: Fundoscopic exam reveals sharp disc  margins. Pupils equal, briskly reactive to light. Extraocular movements full without nystagmus. Visual fields full to confrontation. Hearing intact. Facial sensation intact. Face, tongue, palate moves normally and symmetrically.  Motor: Normal bulk and tone. Normal strength in all tested extremity muscles Sensory.: intact to touch , pinprick , position and vibratory sensation.  Coordination: Rapid alternating movements normal in all extremities. Finger-to-nose and heel-to-shin performed accurately bilaterally. Gait and Station: Arises from chair without difficulty. Stance is normal. Gait demonstrates normal stride length and balance with ***. Tandem walk and heel toe ***.  Reflexes: 1+ and symmetric. Toes downgoing.     NIHSS  *** Modified [place]  ***  ASSESSMENT: Sonya Poole is a 71 y.o. year old female with right MCA scattered infarcts on 0/94/7096 embolic secondary to unclear source s/p ILR. Vascular risk factors include prior stroke 08/2020, HTN, DM, HLD, MM and SVT.      PLAN:  Right MCA cryptogenic stroke:  Residual deficit: ***.  Continue Plavix and pravastatin (Pravachol)  for secondary stroke prevention.   Discussed secondary stroke prevention measures and importance of close PCP follow up for aggressive stroke risk factor management including BP goal<130/90, HLD with LDL goal<70 and DM with A1c.<7 .  Stroke labs 07/2021: LDL 47, A1c 9.9 I have gone over the pathophysiology of stroke, warning signs and symptoms, risk factors and their management in some detail with instructions to go to the closest emergency room for symptoms of concern.     Follow up in *** or call earlier if needed   CC:  [facility] provider: Dr. Leonie Man PCP: System, Provider Not In    I spent *** minutes of face-to-face and non-face-to-face time with patient.  This included previsit chart review including review of recent hospitalization, lab review, study review, order entry, electronic health record  documentation, patient education regarding recent stroke including etiology, secondary stroke prevention measures and importance of managing stroke risk factors, residual deficits and typical recovery time and answered all other questions to patient satisfaction   Frann Rider, AGNP-BC  [facility] [address] [place], [place] [address]  Phone [phone] Fax [phone] Note: This document was prepared with digital dictation and possible smart phrase technology. Any transcriptional errors that result from this process are unintentional.

## 2022-06-11 ENCOUNTER — Inpatient Hospital Stay: Payer: Medicaid Other | Admitting: Adult Health

## 2022-06-11 ENCOUNTER — Encounter: Payer: Self-pay | Admitting: Adult Health

## 2022-06-12 ENCOUNTER — Ambulatory Visit (HOSPITAL_COMMUNITY): Payer: Medicare Other | Admitting: Physician Assistant

## 2022-06-18 ENCOUNTER — Ambulatory Visit (HOSPITAL_COMMUNITY)
Admission: RE | Admit: 2022-06-18 | Discharge: 2022-06-18 | Disposition: A | Discharge status: Home / Self Care | Payer: Medicare Other | Source: Ambulatory Visit | Attending: Nurse Practitioner | Admitting: Nurse Practitioner

## 2022-06-18 VITALS — BP 116/70 | HR 83 | Ht 68.0 in | Wt 239.0 lb

## 2022-06-18 DIAGNOSIS — Z79899 Other long term (current) drug therapy: Secondary | ICD-10-CM | POA: Diagnosis not present

## 2022-06-18 DIAGNOSIS — Z6836 Body mass index (BMI) 36.0-36.9, adult: Secondary | ICD-10-CM | POA: Diagnosis not present

## 2022-06-18 DIAGNOSIS — E669 Obesity, unspecified: Secondary | ICD-10-CM | POA: Diagnosis not present

## 2022-06-18 DIAGNOSIS — R9431 Abnormal electrocardiogram [ECG] [EKG]: Secondary | ICD-10-CM | POA: Insufficient documentation

## 2022-06-18 DIAGNOSIS — I10 Essential (primary) hypertension: Secondary | ICD-10-CM | POA: Diagnosis not present

## 2022-06-18 DIAGNOSIS — D6869 Other thrombophilia: Secondary | ICD-10-CM

## 2022-06-18 DIAGNOSIS — Z7901 Long term (current) use of anticoagulants: Secondary | ICD-10-CM | POA: Insufficient documentation

## 2022-06-18 DIAGNOSIS — Z7984 Long term (current) use of oral hypoglycemic drugs: Secondary | ICD-10-CM | POA: Diagnosis not present

## 2022-06-18 DIAGNOSIS — Z7182 Exercise counseling: Secondary | ICD-10-CM | POA: Diagnosis not present

## 2022-06-18 DIAGNOSIS — E119 Type 2 diabetes mellitus without complications: Secondary | ICD-10-CM | POA: Insufficient documentation

## 2022-06-18 DIAGNOSIS — Z8673 Personal history of transient ischemic attack (TIA), and cerebral infarction without residual deficits: Secondary | ICD-10-CM | POA: Diagnosis not present

## 2022-06-18 DIAGNOSIS — I480 Paroxysmal atrial fibrillation: Secondary | ICD-10-CM | POA: Diagnosis present

## 2022-06-18 NOTE — Progress Notes (Signed)
  Primary Care Physician: System, Provider Not In Primary Cardiologist: none Primary Electrophysiologist: Dr Lambert Referring Physician: Dr Lambert   Sonya Poole is a 71 y.o. female with a history of DM, HTN, CVA, multiple myeloma, atrial fibrillation who presents for follow up in the [facility].  The patient was admitted on 08/18/2021 with slurred speech, left facial droop, and left sided weakness worse than prior (with previous stroke). Imaging demonstrated right MCA scattered infarct embolic secondary to unclear source. She had ILR placed by Dr Lambert. The device clinic received an alert for an afib episode occurring on 11/06/21 which lasted 8.5 hours. Patient has a CHADS2VASC score of 6.  On follow up today, patient reports that she is doing well from a cardia standpoint. She was unaware of her afib at the time. ILR shows 0.1% afib burden with no other episodes noted.   Today, she denies symptoms of palpitations, chest pain, shortness of breath, orthopnea, PND, lower extremity edema, dizziness, presyncope, syncope, snoring, daytime somnolence, bleeding, or neurologic sequela. The patient is tolerating medications without difficulties and is otherwise without complaint today.    Atrial Fibrillation Risk Factors:  she does not have symptoms or diagnosis of sleep apnea. she does not have a history of rheumatic fever.   she has a BMI of Body mass index is 36.34 kg/m.. Filed Weights   06/18/22 1510  Weight: 108.4 kg    No family history on file.   Atrial Fibrillation Management history:  Previous antiarrhythmic drugs: none Previous cardioversions: none Previous ablations: none CHADS2VASC score: 6 Anticoagulation history: none   Past Medical History:  Diagnosis Date   Asthma    Cancer ([facility])    Diabetes mellitus without complication ([facility])    Hypertension    Immune deficiency disorder ([facility])    Peripheral neuropathy    Stroke ([facility])    Past  Surgical History:  Procedure Laterality Date   BUBBLE STUDY  08/21/2021   Procedure: BUBBLE STUDY;  Surgeon: Skains, Mark C, MD;  Location: [facility];  Service: Cardiovascular;;   CHOLECYSTECTOMY     intestine remove     LOOP RECORDER INSERTION N/A 08/21/2021   Procedure: LOOP RECORDER INSERTION;  Surgeon: Lambert, Cameron T, MD;  Location: [facility];  Service: Cardiovascular;  Laterality: N/A;   TEE WITHOUT CARDIOVERSION N/A 08/21/2021   Procedure: TRANSESOPHAGEAL ECHOCARDIOGRAM (TEE);  Surgeon: Skains, Mark C, MD;  Location: [facility];  Service: Cardiovascular;  Laterality: N/A;    Current Outpatient Medications  Medication Sig Dispense Refill   acyclovir (ZOVIRAX) 400 MG tablet Take 400 mg by mouth 2 (two) times daily.     amLODipine (NORVASC) 10 MG tablet Take 10 mg by mouth daily.     folic acid (FOLVITE) 1 MG tablet Take 1 tablet by mouth daily.     gabapentin (NEURONTIN) 300 MG capsule Take 900 mg by mouth in the morning and at bedtime.     Insulin Glargine-Lixisenatide 100-33 UNT-MCG/ML SOPN Inject 38 Units into the skin daily as needed. If blood is 250 or higher     lidocaine-prilocaine (EMLA) cream Apply 1 application topically as needed (port site access every other wednesday).     metFORMIN (GLUCOPHAGE-XR) 500 MG 24 hr tablet Take 500 mg by mouth 2 (two) times daily.     nortriptyline (PAMELOR) 10 MG capsule Take 10 mg by mouth at bedtime.     omeprazole (PRILOSEC) 20 MG capsule Take 20 mg by mouth daily.       oxyCODONE (OXY IR/ROXICODONE) 5 MG immediate release tablet Take 5 mg by mouth daily as needed for moderate pain.     potassium chloride (MICRO-K) 10 MEQ CR capsule Take 10 mEq by mouth 2 (two) times daily.     pravastatin (PRAVACHOL) 20 MG tablet Take 1 tablet (20 mg total) by mouth daily. 90 tablet 0   No current facility-administered medications for this encounter.    Allergies  Allergen Reactions   Lisinopril Swelling    Social History    Socioeconomic History   Marital status: Married    Spouse name: Not on file   Number of children: Not on file   Years of education: Not on file   Highest education level: Not on file  Occupational History   Not on file  Tobacco Use   Smoking status: Never   Smokeless tobacco: Never  Substance and Sexual Activity   Alcohol use: Yes    Alcohol/week: 1.0 standard drink of alcohol    Types: 1 Glasses of wine per week   Drug use: Never   Sexual activity: Yes    Birth control/protection: None  Other Topics Concern   Not on file  Social History Narrative   Not on file   Social Determinants of Health   Financial Resource Strain: Not on file  Food Insecurity: Not on file  Transportation Needs: Not on file  Physical Activity: Not on file  Stress: Not on file  Social Connections: Not on file  Intimate Partner Violence: Not on file     ROS- All systems are reviewed and negative except as per the HPI above.  Physical Exam: Vitals:   06/18/22 1510  BP: 116/70  Pulse: 83  Weight: 108.4 kg  Height: 5' 8" (1.727 m)    GEN- The patient is a well appearing obese female, alert and oriented x 3 today.   Head- normocephalic, atraumatic Eyes-  Sclera clear, conjunctiva pink Ears- hearing intact Oropharynx- clear Neck- supple  Lungs- Clear to ausculation bilaterally, normal work of breathing Heart- Regular rate and rhythm, no murmurs, rubs or gallops  GI- soft, NT, ND, + BS Extremities- no clubbing, cyanosis, or edema MS- no significant deformity or atrophy Skin- no rash or lesion Psych- euthymic mood, full affect  Wt Readings from Last 3 Encounters:  06/18/22 108.4 kg  08/21/21 121.4 kg    EKG today demonstrates  SR Vent. rate 83 BPM PR interval 134 ms QRS duration 84 ms QT/QTcB 402/472 ms  Echo 08/19/21 demonstrated   1. Left ventricular ejection fraction, by estimation, is 60 to 65%. The  left ventricle has normal function. The left ventricle has no regional   wall motion abnormalities. There is mild left ventricular hypertrophy.  Left ventricular diastolic parameters are consistent with Grade I diastolic dysfunction (impaired relaxation).   2. Right ventricular systolic function is normal. The right ventricular  size is normal.   3. The mitral valve is normal in structure. No evidence of mitral valve  regurgitation.   4. The aortic valve was not well visualized. Aortic valve regurgitation  is not visualized. No aortic stenosis is present.   Epic records are reviewed at length today  CHA2DS2-VASc Score = 6  The patient's score is based upon: CHF History: 0 HTN History: 1 Diabetes History: 1 Stroke History: 2 Vascular Disease History: 0 Age Score: 1 Gender Score: 1       ASSESSMENT AND PLAN: 1. Paroxysmal Atrial Fibrillation (ICD10:  I48.0) The patient's CHA2DS2-VASc score is 6,  indicating a 9.7% annual risk of stroke.   General education about afib provided and questions answered. We also discussed her stroke risk and the risks and benefits of anticoagulation. Start Eliquis 5 mg BID, stop ASA Labs in care everywhere reviewed.  Continue to monitor afib burden on ILR  2. Secondary Hypercoagulable State (ICD10:  D68.69) The patient is at significant risk for stroke/thromboembolism based upon her CHA2DS2-VASc Score of 6.  Start Apixaban (Eliquis).   3. Obesity Body mass index is 36.34 kg/m. Lifestyle modification was discussed at length including regular exercise and weight reduction.  4. HTN Stable, no changes today.   Follow up in the AF clinic in one month.    [facility] [address] [place], [place] [address] [phone] 06/18/2022 4:25 PM

## 2022-06-18 NOTE — Patient Instructions (Signed)
Stop aspirin  Stop plavix   Start Eliquis 5mg twice a day 

## 2022-06-28 ENCOUNTER — Telehealth: Payer: Self-pay | Admitting: Cardiology

## 2022-06-28 NOTE — Telephone Encounter (Signed)
  1. Has your device fired? no  2. Is you device beeping? no  3. Are you experiencing draining or swelling at device site? no  4. Are you calling to see if we received your device transmission? no  5. Have you passed out? no  Patient's husband calling to see if device is automatic or if they send a transmission manually. He says they do not know how to send a transmission manually. Phone: [phone]    Please route to [place]

## 2022-06-29 NOTE — Telephone Encounter (Signed)
LMOVM letting patient husband know that her monitor is automatic. They do not need to do anything but sleep near the monitor.

## 2022-06-30 NOTE — Progress Notes (Signed)
Carelink Summary Report / Loop Recorder 

## 2022-07-02 ENCOUNTER — Ambulatory Visit (INDEPENDENT_AMBULATORY_CARE_PROVIDER_SITE_OTHER): Payer: Medicare Other

## 2022-07-02 DIAGNOSIS — I480 Paroxysmal atrial fibrillation: Secondary | ICD-10-CM | POA: Diagnosis not present

## 2022-07-02 LAB — CUP PACEART REMOTE DEVICE CHECK
Date Time Interrogation Session: 20231203230931
Implantable Pulse Generator Implant Date: 20230123

## 2022-07-12 NOTE — Patient Instructions (Incomplete)

## 2022-07-12 NOTE — Progress Notes (Deleted)
[facility] [address] [place]. [place] [address] [phone]       HOSPITAL FOLLOW UP NOTE  [name] Date of Birth:  1950-11-24 Medical Record Number:  [id number]   Reason for Referral:  hospital stroke follow up    SUBJECTIVE:   CHIEF COMPLAINT:  No chief complaint on file.   HPI:   Sonya Poole is a 71 y.o. who  has a past medical history of Asthma, Cancer ([place]), Diabetes mellitus without complication ([place]), Hypertension, Immune deficiency disorder ([place]), Peripheral neuropathy, and Stroke ([place]).  Patient presented on 08/18/2021 with sudden onset of slurred speech. Symptoms resolved within 20 minutes. CT head showed an old right parietal and occipital stroke.  This was seen on previous MRI done at [facility] in March 2022.  It also showed multiple round lytic lesions in her skull consistent with her history of multiple myeloma.  Seen by neurology/stroke team.  MRI brain revealed acute right MCA CVA with scattered infarcts.  MRA head and neck unremarkable.  Bilateral lower extremity Doppler ultrasound negative for DVT.  She was started on dual antiplatelets and statin with plans to continue Plavix only. TEE 08/21/2021 with Dr Marlou Porch showed no left atrial appendage thrombus, possible small PFO. Loop recorder placed. Personally reviewed hospitalization pertinent progress notes, lab work and imaging.  Evaluated by Erlinda Hong.   Since discharge,    She has left sided weakness at baseline from previous CVA. In 2022.  PCP? Plavix??? Pravastaitn?   She continues to follow closely with oncology for multiple myeloma. She is treated for neuropathy with gabapentin. She is receiving home health services with PT and OT.   She is followed by cardiology with [facility]. She has a history of nonsustained SVT, not currently treated.   PERTINENT IMAGING/LABS  MR BRAIN   CTA HEAD/NECK MRA HEAD/NECK   CAROTID ULTRASOUND   2D ECHO   TEE   A1C Lab Results   Component Value Date   HGBA1C 9.9 (H) 08/19/2021    Lipid Panel     Component Value Date/Time   CHOL 118 08/19/2021 0550   TRIG 118 08/19/2021 0550   HDL 47 08/19/2021 0550   CHOLHDL 2.5 08/19/2021 0550   VLDL 24 08/19/2021 0550   LDLCALC 47 08/19/2021 0550      ROS:   14 system review of systems performed and negative with exception of those listed in HPI  PMH:  Past Medical History:  Diagnosis Date   Asthma    Cancer ([place])    Diabetes mellitus without complication ([place])    Hypertension    Immune deficiency disorder ([place])    Peripheral neuropathy    Stroke ([place])     PSH:  Past Surgical History:  Procedure Laterality Date   BUBBLE STUDY  08/21/2021   Procedure: BUBBLE STUDY;  Surgeon: Jerline Pain, MD;  Location: [place];  Service: Cardiovascular;;   CHOLECYSTECTOMY     intestine remove     LOOP RECORDER INSERTION N/A 08/21/2021   Procedure: LOOP RECORDER INSERTION;  Surgeon: Vickie Epley, MD;  Location: [place] CV LAB;  Service: Cardiovascular;  Laterality: N/A;   TEE WITHOUT CARDIOVERSION N/A 08/21/2021   Procedure: TRANSESOPHAGEAL ECHOCARDIOGRAM (TEE);  Surgeon: Jerline Pain, MD;  Location: [facility] ENDOSCOPY;  Service: Cardiovascular;  Laterality: N/A;    Social History:  Social History   Socioeconomic History   Marital status: Married    Spouse name: Not on file   Number of children: Not on  file   Years of education: Not on file   Highest education level: Not on file  Occupational History   Not on file  Tobacco Use   Smoking status: Never   Smokeless tobacco: Never  Substance and Sexual Activity   Alcohol use: Yes    Alcohol/week: 1.0 standard drink of alcohol    Types: 1 Glasses of wine per week   Drug use: Never   Sexual activity: Yes    Birth control/protection: None  Other Topics Concern   Not on file  Social History Narrative   Not on file   Social Determinants of Health   Financial Resource Strain: Not on file  Food  Insecurity: Not on file  Transportation Needs: Not on file  Physical Activity: Not on file  Stress: Not on file  Social Connections: Not on file  Intimate Partner Violence: Not on file    Family History: No family history on file.  Medications:   Current Outpatient Medications on File Prior to Visit  Medication Sig Dispense Refill   acyclovir (ZOVIRAX) 400 MG tablet Take 400 mg by mouth 2 (two) times daily.     amLODipine (NORVASC) 10 MG tablet Take 10 mg by mouth daily.     folic acid (FOLVITE) 1 MG tablet Take 1 tablet by mouth daily.     gabapentin (NEURONTIN) 300 MG capsule Take 900 mg by mouth in the morning and at bedtime.     Insulin Glargine-Lixisenatide 100-33 UNT-MCG/ML SOPN Inject 38 Units into the skin daily as needed. If blood is 250 or higher     lidocaine-prilocaine (EMLA) cream Apply 1 application topically as needed (port site access every other wednesday).     metFORMIN (GLUCOPHAGE-XR) 500 MG 24 hr tablet Take 500 mg by mouth 2 (two) times daily.     nortriptyline (PAMELOR) 10 MG capsule Take 10 mg by mouth at bedtime.     omeprazole (PRILOSEC) 20 MG capsule Take 20 mg by mouth daily.     oxyCODONE (OXY IR/ROXICODONE) 5 MG immediate release tablet Take 5 mg by mouth daily as needed for moderate pain.     potassium chloride (MICRO-K) 10 MEQ CR capsule Take 10 mEq by mouth 2 (two) times daily.     pravastatin (PRAVACHOL) 20 MG tablet Take 1 tablet (20 mg total) by mouth daily. 90 tablet 0   No current facility-administered medications on file prior to visit.    Allergies:   Allergies  Allergen Reactions   Lisinopril Swelling      OBJECTIVE:  Physical Exam  There were no vitals filed for this visit. There is no height or weight on file to calculate BMI. No results found.      No data to display           General: well developed, well nourished, seated, in no evident distress Head: head normocephalic and atraumatic.   Neck: supple with no carotid or  supraclavicular bruits Cardiovascular: regular rate and rhythm, no murmurs Musculoskeletal: no deformity Skin:  no rash/petichiae Vascular:  Normal pulses all extremities   Neurologic Exam Mental Status: Awake and fully alert.  Fluent speech and language.  Oriented to place and time. Recent and remote memory intact. Attention span, concentration and fund of knowledge appropriate. Mood and affect appropriate.  Cranial Nerves: Fundoscopic exam reveals sharp disc margins. Pupils equal, briskly reactive to light. Extraocular movements full without nystagmus. Visual fields full to confrontation. Hearing intact. Facial sensation intact. Face, tongue, palate moves normally and  symmetrically.  Motor: Normal bulk and tone. Normal strength in all tested extremity muscles Sensory.: intact to touch , pinprick , position and vibratory sensation.  Coordination: Rapid alternating movements normal in all extremities. Finger-to-nose and heel-to-shin performed accurately bilaterally. Gait and Station: Arises from chair without difficulty. Stance is normal. Gait demonstrates normal stride length and balance with ***. Tandem walk and heel toe ***.  Reflexes: 1+ and symmetric.    NIHSS  *** Modified Rankin  ***    ASSESSMENT: Sonya Poole is a 71 y.o. year old female ***. Vascular risk factors include advanced age, previous CVA, HLD.      PLAN:  Stroke: right MCA scattered infarct embolic secondary to unclear source : Residual deficit: ***. Continue clopidogrel 75 mg daily  and *** for secondary stroke prevention.  Discussed secondary stroke prevention measures and importance of close PCP follow up for aggressive stroke risk factor management. I have gone over the pathophysiology of stroke, warning signs and symptoms, risk factors and their management in some detail with instructions to go to the closest emergency room for symptoms of concern. HTN: BP goal <130/90.  Stable on *** per PCP HLD: LDL goal <70.  LDL 47 in hospital 07/2021. Continue pravastatin 39m daily per PCP. .Marland Kitchen DMII: A1c goal<7.0.  A1c in hospital 9.9.  Multiple myeloma: continue close follow up with oncology Neuropathy: continue gabapentin as directed by oncology.    Follow up in *** or call earlier if needed   CC:  [facility] provider: Dr. SLeonie ManPCP: System, Provider Not In    I spent *** minutes of face-to-face and non-face-to-face time with patient.  This included previsit chart review including review of recent hospitalization, lab review, study review, order entry, electronic health record documentation, patient education regarding recent stroke including etiology, secondary stroke prevention measures and importance of managing stroke risk factors, residual deficits and typical recovery time and answered all other questions to patient satisfaction   ADebbora Presto [facility] GM[facility] Ltd[facility] [address] DriveSMidwayGPort Barre [place] [address] Phone [phone] [phone]: This document was prepared with digital dictation and possible smart phrase technology. Any transcriptional errors that result from this process are unintentional.

## 2022-07-17 ENCOUNTER — Inpatient Hospital Stay: Payer: Medicaid Other | Admitting: Family Medicine

## 2022-07-17 ENCOUNTER — Encounter: Payer: Self-pay | Admitting: Family Medicine

## 2022-07-19 ENCOUNTER — Ambulatory Visit (HOSPITAL_COMMUNITY): Payer: Medicare Other | Admitting: Physician Assistant

## 2022-08-06 ENCOUNTER — Ambulatory Visit (INDEPENDENT_AMBULATORY_CARE_PROVIDER_SITE_OTHER): Payer: 59

## 2022-08-06 DIAGNOSIS — I480 Paroxysmal atrial fibrillation: Secondary | ICD-10-CM

## 2022-08-07 LAB — CUP PACEART REMOTE DEVICE CHECK
Date Time Interrogation Session: 20240107231654
Implantable Pulse Generator Implant Date: 20230123

## 2022-08-09 NOTE — Progress Notes (Signed)
Carelink Summary Report / Loop Recorder 

## 2022-08-13 ENCOUNTER — Encounter (HOSPITAL_COMMUNITY): Payer: Self-pay

## 2022-08-13 ENCOUNTER — Ambulatory Visit (HOSPITAL_COMMUNITY): Payer: Medicare Other | Admitting: Physician Assistant

## 2022-08-22 ENCOUNTER — Other Ambulatory Visit (HOSPITAL_COMMUNITY): Payer: Self-pay | Admitting: *Deleted

## 2022-08-22 ENCOUNTER — Other Ambulatory Visit: Payer: Self-pay | Admitting: *Deleted

## 2022-08-22 MED ORDER — APIXABAN 5 MG PO TABS: 5.0000 mg | ORAL_TABLET | Freq: Two times a day (BID) | ORAL | 3 refills | Status: DC

## 2022-09-06 ENCOUNTER — Encounter (HOSPITAL_COMMUNITY): Payer: Self-pay | Admitting: *Deleted

## 2022-09-09 LAB — CUP PACEART REMOTE DEVICE CHECK
Date Time Interrogation Session: 20240209231140
Implantable Pulse Generator Implant Date: 20230123

## 2022-09-10 ENCOUNTER — Ambulatory Visit: Payer: 59

## 2022-09-10 DIAGNOSIS — I480 Paroxysmal atrial fibrillation: Secondary | ICD-10-CM

## 2022-09-10 NOTE — Progress Notes (Signed)
Carelink Summary Report / Loop Recorder 

## 2022-10-15 ENCOUNTER — Ambulatory Visit (INDEPENDENT_AMBULATORY_CARE_PROVIDER_SITE_OTHER): Payer: 59

## 2022-10-15 DIAGNOSIS — I480 Paroxysmal atrial fibrillation: Secondary | ICD-10-CM | POA: Diagnosis not present

## 2022-10-16 LAB — CUP PACEART REMOTE DEVICE CHECK
Date Time Interrogation Session: 20240317231558
Implantable Pulse Generator Implant Date: 20230123

## 2022-10-22 NOTE — Progress Notes (Deleted)
[facility] [address] [place]. [place] [address] [phone]       HOSPITAL FOLLOW UP NOTE  Sonya Poole Date of Birth:  06-17-1951 Medical Record Number:  [id number]   Reason for Referral:  hospital stroke follow up    SUBJECTIVE:   CHIEF COMPLAINT:  No chief complaint on file.   HPI:   Sonya Poole is a 72 y.o. who  has a past medical history of Asthma, Cancer ([place]), Diabetes mellitus without complication ([place]), Hypertension, Immune deficiency disorder ([place]), Peripheral neuropathy, and Stroke ([place]).  Patient presented on *** with ***. Personally reviewed hospitalization pertinent progress notes, lab work and imaging.  Evaluated by ***.      PERTINENT IMAGING/LABS  MR BRAIN   CTA HEAD/NECK MRA HEAD/NECK   CAROTID ULTRASOUND   2D ECHO   TEE   A1C Lab Results  Component Value Date   HGBA1C 9.9 (H) 08/19/2021    Lipid Panel     Component Value Date/Time   CHOL 118 08/19/2021 0550   TRIG 118 08/19/2021 0550   HDL 47 08/19/2021 0550   CHOLHDL 2.5 08/19/2021 0550   VLDL 24 08/19/2021 0550   LDLCALC 47 08/19/2021 0550      ROS:   14 system review of systems performed and negative with exception of those listed in HPI  PMH:  Past Medical History:  Diagnosis Date   Asthma    Cancer ([place])    Diabetes mellitus without complication ([place])    Hypertension    Immune deficiency disorder ([place])    Peripheral neuropathy    Stroke ([place])     PSH:  Past Surgical History:  Procedure Laterality Date   BUBBLE STUDY  08/21/2021   Procedure: BUBBLE STUDY;  Surgeon: Jerline Pain, MD;  Location: [place];  Service: Cardiovascular;;   CHOLECYSTECTOMY     intestine remove     LOOP RECORDER INSERTION N/A 08/21/2021   Procedure: LOOP RECORDER INSERTION;  Surgeon: Vickie Epley, MD;  Location: [place] CV [facility];  Service: Cardiovascular;  Laterality: N/A;   TEE WITHOUT CARDIOVERSION N/A 08/21/2021   Procedure:  TRANSESOPHAGEAL ECHOCARDIOGRAM (TEE);  Surgeon: Jerline Pain, MD;  [facility] ENDOSCOPY;  Service: Cardiovascular;  Laterality: N/A;    Social History:  Social History   Socioeconomic History   Marital status: Married    Spouse name: Not on file   Number of children: Not on file   Years of education: Not on file   Highest education level: Not on file  Occupational History   Not on file  Tobacco Use   Smoking status: Never   Smokeless tobacco: Never  Substance and Sexual Activity   Alcohol use: Yes    Alcohol/week: 1.0 standard drink of alcohol    Types: 1 Glasses of wine per week   Drug use: Never   Sexual activity: Yes    Birth control/protection: None  Other Topics Concern   Not on file  Social History Narrative   Not on file   Social Determinants of Health   Financial Resource Strain: Not on file  Food Insecurity: Not on file  Transportation Needs: Not on file  Physical Activity: Not on file  Stress: Not on file  Social Connections: Not on file  Intimate Partner Violence: Not on file    Family History: No family history on file.  Medications:   Current Outpatient Medications on File Prior to Visit  Medication Sig Dispense Refill   acyclovir (ZOVIRAX) 400  MG tablet Take 400 mg by mouth 2 (two) times daily.     amLODipine (NORVASC) 10 MG tablet Take 10 mg by mouth daily.     apixaban (ELIQUIS) 5 MG TABS tablet Take 1 tablet (5 mg total) by mouth 2 (two) times daily. 60 tablet 3   folic acid (FOLVITE) 1 MG tablet Take 1 tablet by mouth daily.     gabapentin (NEURONTIN) 300 MG capsule Take 900 mg by mouth in the morning and at bedtime.     Insulin Glargine-Lixisenatide 100-33 UNT-MCG/ML SOPN Inject 38 Units into the skin daily as needed. If blood is 250 or higher     lidocaine-prilocaine (EMLA) cream Apply 1 application topically as needed (port site access every other wednesday).     metFORMIN (GLUCOPHAGE-XR) 500 MG 24 hr tablet Take 500 mg by mouth 2 (two)  times daily.     nortriptyline (PAMELOR) 10 MG capsule Take 10 mg by mouth at bedtime.     omeprazole (PRILOSEC) 20 MG capsule Take 20 mg by mouth daily.     oxyCODONE (OXY IR/ROXICODONE) 5 MG immediate release tablet Take 5 mg by mouth daily as needed for moderate pain.     potassium chloride (MICRO-K) 10 MEQ CR capsule Take 10 mEq by mouth 2 (two) times daily.     pravastatin (PRAVACHOL) 20 MG tablet Take 1 tablet (20 mg total) by mouth daily. 90 tablet 0   No current facility-administered medications on file prior to visit.    Allergies:   Allergies  Allergen Reactions   Lisinopril Swelling      OBJECTIVE:  Physical Exam  There were no vitals filed for this visit. There is no height or weight on file to calculate BMI. No results found.      No data to display           General: well developed, well nourished, seated, in no evident distress Head: head normocephalic and atraumatic.   Neck: supple with no carotid or supraclavicular bruits Cardiovascular: regular rate and rhythm, no murmurs Musculoskeletal: no deformity Skin:  no rash/petichiae Vascular:  Normal pulses all extremities   Neurologic Exam Mental Status: Awake and fully alert.  Fluent speech and language.  Oriented to place and time. Recent and remote memory intact. Attention span, concentration and fund of knowledge appropriate. Mood and affect appropriate.  Cranial Nerves: Fundoscopic exam reveals sharp disc margins. Pupils equal, briskly reactive to light. Extraocular movements full without nystagmus. Visual fields full to confrontation. Hearing intact. Facial sensation intact. Face, tongue, palate moves normally and symmetrically.  Motor: Normal bulk and tone. Normal strength in all tested extremity muscles Sensory.: intact to touch , pinprick , position and vibratory sensation.  Coordination: Rapid alternating movements normal in all extremities. Finger-to-nose and heel-to-shin performed accurately  bilaterally. Gait and Station: Arises from chair without difficulty. Stance is normal. Gait demonstrates normal stride length and balance with ***. Tandem walk and heel toe ***.  Reflexes: 1+ and symmetric.    NIHSS  *** Modified Rankin  ***    ASSESSMENT: Sonya Poole is a 72 y.o. year old female ***. Vascular risk factors include ***.      PLAN:  *** : Residual deficit: ***. Continue {anticoagulants:31417}  and ***  for secondary stroke prevention.  Discussed secondary stroke prevention measures and importance of close PCP follow up for aggressive stroke risk factor management. I have gone over the pathophysiology of stroke, warning signs and symptoms, risk factors and their management in some  detail with instructions to go to the closest emergency room for symptoms of concern. HTN: BP goal <130/90.  Stable on *** per PCP HLD: LDL goal <70. Recent LDL ***.  DMII: A1c goal<7.0. Recent A1c ***.    Follow up in *** or call earlier if needed   CC:  [facility] provider: Dr. Leonie Man PCP: System, Provider Not In    I spent *** minutes of face-to-face and non-face-to-face time with patient.  This included previsit chart review including review of recent hospitalization, lab review, study review, order entry, electronic health record documentation, patient education regarding recent stroke including etiology, secondary stroke prevention measures and importance of managing stroke risk factors, residual deficits and typical recovery time and answered all other questions to patient satisfaction   Debbora Presto, [facility]  [facility] [address] [place], [place] [address]  Phone [phone] Fax [phone] Note: This document was prepared with digital dictation and possible smart phrase technology. Any transcriptional errors that result from this process are unintentional.

## 2022-10-23 ENCOUNTER — Inpatient Hospital Stay: Payer: 59 | Admitting: Family Medicine

## 2022-10-24 NOTE — Progress Notes (Signed)
Carelink Summary Report / Loop Recorder 

## 2022-11-19 ENCOUNTER — Ambulatory Visit: Payer: 59

## 2022-11-19 LAB — CUP PACEART REMOTE DEVICE CHECK
Date Time Interrogation Session: 20240419230620
Implantable Pulse Generator Implant Date: 20230123

## 2022-11-21 ENCOUNTER — Inpatient Hospital Stay: Payer: 59 | Admitting: Adult Health

## 2022-11-22 ENCOUNTER — Ambulatory Visit (INDEPENDENT_AMBULATORY_CARE_PROVIDER_SITE_OTHER): Payer: 59

## 2022-11-22 DIAGNOSIS — G459 Transient cerebral ischemic attack, unspecified: Secondary | ICD-10-CM

## 2022-11-26 NOTE — Progress Notes (Signed)
Carelink Summary Report / Loop Recorder 

## 2022-12-18 NOTE — Progress Notes (Signed)
Carelink Summary Report / Loop Recorder 

## 2022-12-20 LAB — CUP PACEART REMOTE DEVICE CHECK
Date Time Interrogation Session: 20240522230639
Implantable Pulse Generator Implant Date: 20230123

## 2022-12-25 ENCOUNTER — Ambulatory Visit (INDEPENDENT_AMBULATORY_CARE_PROVIDER_SITE_OTHER): Payer: 59

## 2022-12-25 DIAGNOSIS — G459 Transient cerebral ischemic attack, unspecified: Secondary | ICD-10-CM | POA: Diagnosis not present

## 2023-01-08 ENCOUNTER — Encounter: Payer: Self-pay | Admitting: Adult Health

## 2023-01-08 ENCOUNTER — Inpatient Hospital Stay: Payer: 59 | Admitting: Adult Health

## 2023-01-18 NOTE — Progress Notes (Signed)
Carelink Summary Report / Loop Recorder 

## 2023-01-21 ENCOUNTER — Ambulatory Visit (INDEPENDENT_AMBULATORY_CARE_PROVIDER_SITE_OTHER): Payer: 59

## 2023-01-21 DIAGNOSIS — G459 Transient cerebral ischemic attack, unspecified: Secondary | ICD-10-CM

## 2023-01-22 LAB — CUP PACEART REMOTE DEVICE CHECK
Date Time Interrogation Session: 20240624230905
Implantable Pulse Generator Implant Date: 20230123

## 2023-01-28 ENCOUNTER — Ambulatory Visit: Payer: 59

## 2023-02-08 NOTE — Progress Notes (Signed)
Carelink Summary Report / Loop Recorder 

## 2023-02-24 LAB — CUP PACEART REMOTE DEVICE CHECK
Date Time Interrogation Session: 20240727230518
Implantable Pulse Generator Implant Date: 20230123

## 2023-02-25 ENCOUNTER — Ambulatory Visit (INDEPENDENT_AMBULATORY_CARE_PROVIDER_SITE_OTHER): Payer: 59

## 2023-02-25 DIAGNOSIS — G459 Transient cerebral ischemic attack, unspecified: Secondary | ICD-10-CM

## 2023-03-04 ENCOUNTER — Ambulatory Visit: Payer: 59

## 2023-03-04 ENCOUNTER — Inpatient Hospital Stay: Payer: 59 | Admitting: Adult Health

## 2023-03-04 ENCOUNTER — Encounter: Payer: Self-pay | Admitting: Adult Health

## 2023-03-13 NOTE — Progress Notes (Signed)
Carelink Summary Report / Loop Recorder 

## 2023-03-31 LAB — CUP PACEART REMOTE DEVICE CHECK
Date Time Interrogation Session: 20240829230538
Implantable Pulse Generator Implant Date: 20230123

## 2023-04-02 ENCOUNTER — Ambulatory Visit (INDEPENDENT_AMBULATORY_CARE_PROVIDER_SITE_OTHER): Payer: 59

## 2023-04-02 DIAGNOSIS — G459 Transient cerebral ischemic attack, unspecified: Secondary | ICD-10-CM

## 2023-04-08 ENCOUNTER — Ambulatory Visit: Payer: 59

## 2023-04-10 NOTE — Progress Notes (Signed)
Carelink Summary Report / Loop Recorder 

## 2023-05-02 LAB — CUP PACEART REMOTE DEVICE CHECK
Date Time Interrogation Session: 20241001230851
Implantable Pulse Generator Implant Date: 20230123

## 2023-05-06 ENCOUNTER — Ambulatory Visit: Payer: 59

## 2023-05-06 ENCOUNTER — Ambulatory Visit (INDEPENDENT_AMBULATORY_CARE_PROVIDER_SITE_OTHER): Payer: 59

## 2023-05-06 DIAGNOSIS — G459 Transient cerebral ischemic attack, unspecified: Secondary | ICD-10-CM | POA: Diagnosis not present

## 2023-05-20 NOTE — Progress Notes (Signed)
Carelink Summary Report / Loop Recorder 

## 2023-06-10 ENCOUNTER — Ambulatory Visit: Payer: 59

## 2023-06-10 DIAGNOSIS — G459 Transient cerebral ischemic attack, unspecified: Secondary | ICD-10-CM

## 2023-06-11 LAB — CUP PACEART REMOTE DEVICE CHECK
Date Time Interrogation Session: 20241110231127
Implantable Pulse Generator Implant Date: 20230123

## 2023-07-04 NOTE — Progress Notes (Signed)
Carelink Summary Report / Loop Recorder 

## 2023-07-15 ENCOUNTER — Ambulatory Visit (INDEPENDENT_AMBULATORY_CARE_PROVIDER_SITE_OTHER): Payer: 59

## 2023-07-15 ENCOUNTER — Ambulatory Visit: Payer: 59

## 2023-07-15 DIAGNOSIS — G459 Transient cerebral ischemic attack, unspecified: Secondary | ICD-10-CM

## 2023-07-15 LAB — CUP PACEART REMOTE DEVICE CHECK
Date Time Interrogation Session: 20241215230850
Implantable Pulse Generator Implant Date: 20230123

## 2023-07-16 ENCOUNTER — Other Ambulatory Visit (HOSPITAL_COMMUNITY): Payer: Self-pay | Admitting: Physician Assistant

## 2023-08-06 ENCOUNTER — Encounter: Payer: Self-pay | Admitting: Cardiology

## 2023-08-12 ENCOUNTER — Other Ambulatory Visit (HOSPITAL_COMMUNITY): Payer: Self-pay | Admitting: Physician Assistant

## 2023-08-19 ENCOUNTER — Ambulatory Visit: Payer: 59

## 2023-08-19 NOTE — Progress Notes (Signed)
Carelink Summary Report / Loop Recorder 

## 2023-09-23 ENCOUNTER — Ambulatory Visit: Payer: 59

## 2023-09-23 ENCOUNTER — Ambulatory Visit (INDEPENDENT_AMBULATORY_CARE_PROVIDER_SITE_OTHER): Payer: 59

## 2023-09-23 DIAGNOSIS — G459 Transient cerebral ischemic attack, unspecified: Secondary | ICD-10-CM

## 2023-09-24 ENCOUNTER — Encounter: Payer: Self-pay | Admitting: Cardiology

## 2023-09-24 ENCOUNTER — Other Ambulatory Visit (HOSPITAL_COMMUNITY): Payer: Self-pay | Admitting: *Deleted

## 2023-09-24 LAB — CUP PACEART REMOTE DEVICE CHECK
Date Time Interrogation Session: 20250223231057
Implantable Pulse Generator Implant Date: 20230123

## 2023-09-24 MED ORDER — APIXABAN 5 MG PO TABS: 5.0000 mg | ORAL_TABLET | Freq: Two times a day (BID) | ORAL | 0 refills | Status: DC

## 2023-10-28 ENCOUNTER — Ambulatory Visit (INDEPENDENT_AMBULATORY_CARE_PROVIDER_SITE_OTHER): Payer: 59

## 2023-10-28 ENCOUNTER — Ambulatory Visit: Payer: 59

## 2023-10-28 DIAGNOSIS — G459 Transient cerebral ischemic attack, unspecified: Secondary | ICD-10-CM

## 2023-10-28 LAB — CUP PACEART REMOTE DEVICE CHECK
Date Time Interrogation Session: 20250330230937
Implantable Pulse Generator Implant Date: 20230123

## 2023-10-30 NOTE — Addendum Note (Signed)
 Addended by: Geralyn Flash D on: 10/30/2023 09:52 AM   Modules accepted: Orders

## 2023-10-30 NOTE — Progress Notes (Signed)
 [facility] Summary Report / Loop Recorder

## 2023-11-02 ENCOUNTER — Encounter: Payer: Self-pay | Admitting: Cardiology

## 2023-12-02 ENCOUNTER — Ambulatory Visit (INDEPENDENT_AMBULATORY_CARE_PROVIDER_SITE_OTHER): Payer: 59

## 2023-12-02 ENCOUNTER — Ambulatory Visit: Payer: 59

## 2023-12-02 DIAGNOSIS — G459 Transient cerebral ischemic attack, unspecified: Secondary | ICD-10-CM

## 2023-12-02 LAB — CUP PACEART REMOTE DEVICE CHECK
Date Time Interrogation Session: 20250504231353
Implantable Pulse Generator Implant Date: 20230123

## 2023-12-04 ENCOUNTER — Encounter: Payer: Self-pay | Admitting: Cardiology

## 2023-12-09 ENCOUNTER — Other Ambulatory Visit (HOSPITAL_COMMUNITY): Payer: Self-pay | Admitting: Physician Assistant

## 2023-12-11 NOTE — Progress Notes (Signed)
 [facility] Summary Report / Loop Recorder

## 2024-01-02 ENCOUNTER — Ambulatory Visit (INDEPENDENT_AMBULATORY_CARE_PROVIDER_SITE_OTHER)

## 2024-01-02 DIAGNOSIS — G459 Transient cerebral ischemic attack, unspecified: Secondary | ICD-10-CM

## 2024-01-02 LAB — CUP PACEART REMOTE DEVICE CHECK
Date Time Interrogation Session: 20250604231447
Implantable Pulse Generator Implant Date: 20230123

## 2024-01-04 ENCOUNTER — Ambulatory Visit: Payer: Self-pay | Admitting: Cardiology

## 2024-01-06 ENCOUNTER — Ambulatory Visit: Payer: 59

## 2024-01-07 ENCOUNTER — Other Ambulatory Visit (HOSPITAL_COMMUNITY): Payer: Self-pay

## 2024-01-07 ENCOUNTER — Other Ambulatory Visit (HOSPITAL_COMMUNITY): Payer: Self-pay | Admitting: Physician Assistant

## 2024-01-20 NOTE — Progress Notes (Signed)
 [facility] Summary Report / Loop Recorder

## 2024-02-03 ENCOUNTER — Ambulatory Visit

## 2024-02-03 DIAGNOSIS — G459 Transient cerebral ischemic attack, unspecified: Secondary | ICD-10-CM | POA: Diagnosis not present

## 2024-02-03 LAB — CUP PACEART REMOTE DEVICE CHECK
Date Time Interrogation Session: 20250706231835
Implantable Pulse Generator Implant Date: 20230123

## 2024-02-08 ENCOUNTER — Ambulatory Visit: Payer: Self-pay | Admitting: Cardiology

## 2024-02-10 ENCOUNTER — Ambulatory Visit: Payer: 59

## 2024-02-19 NOTE — Progress Notes (Signed)
 [facility] Summary Report / Loop Recorder

## 2024-03-05 ENCOUNTER — Ambulatory Visit

## 2024-03-05 DIAGNOSIS — G459 Transient cerebral ischemic attack, unspecified: Secondary | ICD-10-CM | POA: Diagnosis not present

## 2024-03-05 LAB — CUP PACEART REMOTE DEVICE CHECK
Date Time Interrogation Session: 20250806231247
Implantable Pulse Generator Implant Date: 20230123

## 2024-03-06 ENCOUNTER — Ambulatory Visit: Payer: Self-pay | Admitting: Cardiology

## 2024-03-16 ENCOUNTER — Ambulatory Visit: Payer: 59

## 2024-03-16 ENCOUNTER — Ambulatory Visit

## 2024-04-06 ENCOUNTER — Ambulatory Visit

## 2024-04-06 DIAGNOSIS — G459 Transient cerebral ischemic attack, unspecified: Secondary | ICD-10-CM

## 2024-04-06 LAB — CUP PACEART REMOTE DEVICE CHECK
Date Time Interrogation Session: 20250906231059
Implantable Pulse Generator Implant Date: 20230123

## 2024-04-12 ENCOUNTER — Ambulatory Visit: Payer: Self-pay | Admitting: Cardiology

## 2024-04-16 NOTE — Progress Notes (Signed)
 Remote Loop Recorder Transmission

## 2024-04-20 ENCOUNTER — Ambulatory Visit

## 2024-04-25 NOTE — Progress Notes (Signed)
 Remote Loop Recorder Transmission

## 2024-05-07 ENCOUNTER — Ambulatory Visit

## 2024-05-08 ENCOUNTER — Ambulatory Visit (INDEPENDENT_AMBULATORY_CARE_PROVIDER_SITE_OTHER)

## 2024-05-08 DIAGNOSIS — G459 Transient cerebral ischemic attack, unspecified: Secondary | ICD-10-CM

## 2024-05-08 LAB — CUP PACEART REMOTE DEVICE CHECK
Date Time Interrogation Session: 20251009231306
Implantable Pulse Generator Implant Date: 20230123

## 2024-05-11 ENCOUNTER — Ambulatory Visit: Payer: Self-pay | Admitting: Cardiology

## 2024-05-11 NOTE — Progress Notes (Signed)
 Remote Loop Recorder Transmission

## 2024-05-25 ENCOUNTER — Ambulatory Visit

## 2024-06-08 ENCOUNTER — Ambulatory Visit (INDEPENDENT_AMBULATORY_CARE_PROVIDER_SITE_OTHER)

## 2024-06-08 DIAGNOSIS — G459 Transient cerebral ischemic attack, unspecified: Secondary | ICD-10-CM

## 2024-06-08 LAB — CUP PACEART REMOTE DEVICE CHECK
Date Time Interrogation Session: 20251109232057
Implantable Pulse Generator Implant Date: 20230123

## 2024-06-10 ENCOUNTER — Ambulatory Visit: Payer: Self-pay | Admitting: Cardiology

## 2024-06-11 NOTE — Progress Notes (Signed)
 Remote Loop Recorder Transmission

## 2024-07-09 ENCOUNTER — Ambulatory Visit

## 2024-07-09 DIAGNOSIS — I480 Paroxysmal atrial fibrillation: Secondary | ICD-10-CM | POA: Diagnosis not present

## 2024-07-09 LAB — CUP PACEART REMOTE DEVICE CHECK
Date Time Interrogation Session: 20251210231700
Implantable Pulse Generator Implant Date: 20230123

## 2024-07-16 NOTE — Progress Notes (Signed)
 Remote Loop Recorder Transmission

## 2024-07-17 ENCOUNTER — Ambulatory Visit: Payer: Self-pay | Admitting: Cardiology

## 2024-08-09 ENCOUNTER — Ambulatory Visit

## 2024-08-09 DIAGNOSIS — G459 Transient cerebral ischemic attack, unspecified: Secondary | ICD-10-CM | POA: Diagnosis not present

## 2024-08-10 LAB — CUP PACEART REMOTE DEVICE CHECK
Date Time Interrogation Session: 20260110231510
Implantable Pulse Generator Implant Date: 20230123

## 2024-08-11 NOTE — Progress Notes (Signed)
 Remote Loop Recorder Transmission

## 2024-08-16 ENCOUNTER — Ambulatory Visit: Payer: Self-pay | Admitting: Cardiology

## 2024-09-09 ENCOUNTER — Ambulatory Visit

## 2024-10-10 ENCOUNTER — Ambulatory Visit

## 2024-11-10 ENCOUNTER — Ambulatory Visit
